# Patient Record
Sex: Female | Born: 1955 | Race: White | Hispanic: No | Marital: Married | Smoking: Former smoker
Health system: Southern US, Community
[De-identification: ages and names within clinical notes are randomized; demographics above are authoritative.]

## PROBLEM LIST (undated history)

## (undated) DIAGNOSIS — F32A Depression, unspecified: Secondary | ICD-10-CM

## (undated) DIAGNOSIS — M797 Fibromyalgia: Secondary | ICD-10-CM

## (undated) DIAGNOSIS — M549 Dorsalgia, unspecified: Secondary | ICD-10-CM

## (undated) DIAGNOSIS — F410 Panic disorder [episodic paroxysmal anxiety] without agoraphobia: Secondary | ICD-10-CM

## (undated) DIAGNOSIS — F329 Major depressive disorder, single episode, unspecified: Secondary | ICD-10-CM

## (undated) DIAGNOSIS — E119 Type 2 diabetes mellitus without complications: Secondary | ICD-10-CM

## (undated) DIAGNOSIS — I10 Essential (primary) hypertension: Secondary | ICD-10-CM

## (undated) DIAGNOSIS — F419 Anxiety disorder, unspecified: Secondary | ICD-10-CM

## (undated) HISTORY — PX: PARTIAL HYSTERECTOMY: SHX80

---

## 1998-01-22 ENCOUNTER — Emergency Department (HOSPITAL_COMMUNITY): Admission: EM | Admit: 1998-01-22 | Discharge: 1998-01-22 | Payer: Self-pay | Admitting: Emergency Medicine

## 1998-12-29 ENCOUNTER — Emergency Department (HOSPITAL_COMMUNITY): Admission: EM | Admit: 1998-12-29 | Discharge: 1998-12-29 | Payer: Self-pay | Admitting: Emergency Medicine

## 1999-01-26 ENCOUNTER — Encounter: Admission: RE | Admit: 1999-01-26 | Discharge: 1999-01-26 | Payer: Self-pay | Admitting: Obstetrics & Gynecology

## 1999-07-25 ENCOUNTER — Encounter: Admission: RE | Admit: 1999-07-25 | Discharge: 1999-07-25 | Payer: Self-pay | Admitting: Internal Medicine

## 1999-10-02 ENCOUNTER — Encounter: Admission: RE | Admit: 1999-10-02 | Discharge: 1999-10-02 | Payer: Self-pay | Admitting: Internal Medicine

## 2001-01-21 ENCOUNTER — Encounter: Payer: Self-pay | Admitting: Urology

## 2001-01-21 ENCOUNTER — Encounter: Admission: RE | Admit: 2001-01-21 | Discharge: 2001-01-21 | Payer: Self-pay | Admitting: Urology

## 2002-03-26 ENCOUNTER — Encounter: Payer: Self-pay | Admitting: Family Medicine

## 2002-03-26 ENCOUNTER — Encounter: Admission: RE | Admit: 2002-03-26 | Discharge: 2002-03-26 | Payer: Self-pay | Admitting: Family Medicine

## 2002-08-26 ENCOUNTER — Encounter: Payer: Self-pay | Admitting: Family Medicine

## 2002-08-26 ENCOUNTER — Encounter: Admission: RE | Admit: 2002-08-26 | Discharge: 2002-08-26 | Payer: Self-pay | Admitting: Family Medicine

## 2005-06-18 ENCOUNTER — Ambulatory Visit (HOSPITAL_COMMUNITY): Admission: RE | Admit: 2005-06-18 | Discharge: 2005-06-18 | Payer: Self-pay | Admitting: Family Medicine

## 2005-09-14 ENCOUNTER — Encounter: Admission: RE | Admit: 2005-09-14 | Discharge: 2005-09-14 | Payer: Self-pay | Admitting: Gastroenterology

## 2005-11-14 ENCOUNTER — Ambulatory Visit (HOSPITAL_COMMUNITY): Admission: RE | Admit: 2005-11-14 | Discharge: 2005-11-14 | Payer: Self-pay | Admitting: General Surgery

## 2005-11-14 ENCOUNTER — Encounter (INDEPENDENT_AMBULATORY_CARE_PROVIDER_SITE_OTHER): Payer: Self-pay | Admitting: *Deleted

## 2005-12-12 ENCOUNTER — Encounter: Admission: RE | Admit: 2005-12-12 | Discharge: 2005-12-12 | Payer: Self-pay | Admitting: Family Medicine

## 2006-08-02 ENCOUNTER — Encounter: Admission: RE | Admit: 2006-08-02 | Discharge: 2006-08-02 | Payer: Self-pay | Admitting: Gastroenterology

## 2006-10-19 ENCOUNTER — Encounter: Admission: RE | Admit: 2006-10-19 | Discharge: 2006-10-19 | Payer: Self-pay | Admitting: Family Medicine

## 2007-09-28 ENCOUNTER — Emergency Department (HOSPITAL_COMMUNITY): Admission: EM | Admit: 2007-09-28 | Discharge: 2007-09-28 | Payer: Self-pay | Admitting: *Deleted

## 2010-10-15 ENCOUNTER — Encounter: Payer: Self-pay | Admitting: Family Medicine

## 2011-02-09 NOTE — Op Note (Signed)
Sheryl Stone, Sheryl Stone NO.:  [id number]   MEDICAL RECORD NO.:  [id number]          PATIENT TYPE:  INP   LOCATION:  2899                         FACILITY:  [facility]   PHYSICIAN:  Cherylynn Ridges, M.D.    DATE OF BIRTH:  March 13, 1956   DATE OF PROCEDURE:  11/14/2005  DATE OF DISCHARGE:  11/14/2005                                 OPERATIVE REPORT   PREOPERATIVE DIAGNOSES:  Symptomatic cholelithiasis and gallbladder polyps.   POSTOPERATIVE DIAGNOSES:  Symptomatic cholelithiasis and gallbladder polyps.   OPERATION PERFORMED:  Laparoscopic cholecystectomy with cholangiogram.   SURGEON:  Marta Lamas. Lindie Spruce, M.D.   ASSISTANT:  Wilmon Arms. Tsuei, M.D..   ANESTHESIA:  General endotracheal.   ESTIMATED BLOOD LOSS:  Less than 20 mL.   COMPLICATIONS:  None.   CONDITION:  Stable.   INDICATIONS FOR PROCEDURE:  The patient is a 55 year old with gallstones and  gallbladder polyps and abdominal pain who comes for an elective laparoscopic  cholecystectomy.   OPERATIVE FINDINGS:  The patient had mild adhesions to her gallbladder to  the body and near the infundibulum. The cholangiogram showed normal flow  into the duodenum, no dilated common duct, no intraductal defects, good  proximal flow.   DESCRIPTION OF PROCEDURE:  The patient was taken to the operating room and  placed on the table in the supine position.  After an adequate general  anesthetic was administered, the patient was prepped and draped in the usual  sterile manner exposing the midline and the right upper quadrant.   A supraumbilical curvilinear incision was made using a #11 blade and taken  down to the midline fascia.  It was at this midline fascia that Kocher  clamps were attached to the fascia and incision made between there with a  #15 blade then we bluntly dissected down to the peritoneum using a Kelly  clamp.   Once we had adequately entered the peritoneal cavity, a pursestring suture  of 0 Vicryl was passed  around the fascial incision. Then Hasson cannula  passed in place and secured in place with pursestring suture.   We insufflated carbon dioxide gas up to a maximal pressure of 15 mmHg after  inserting the Hasson cannula.  We then passed through the right costal  margin 5 mm cannulas, in the subxiphoid 11-12 cannula under direct vision.  Once all cannulas were in place and the abdomen was adequately insufflated  and distended, we placed the patient in reversed Trendelenburg.  The left  side was tilted down and the dissection begun.   There were some mild adhesions to the body and the infundibulum of the  gallbladder which were taken down bluntly using dissector and  electrocautery.  We then dissected out the peritoneum overlying the triangle  of Calot and the hepatoduodenal triangle exposing the cystic duct and the  cystic artery.  In this case the cystic artery coursed external to the  triangle of Calot and was outside of the cystic duct.  We endoclipped it  triply proximally and then distally times one along the gallbladder side and  then transected the artery.  We then  dissected out the cystic duct and then  placed a proximal clip along the gallbladder side.  Cholecystodochotomy was  made using endoscopic scissors, then a Cook catheter which had been passed  through the anterior abdominal wall was passed into the cholecystodochotomy  and the cholangiogram was done as previously dictated.   Once the cholangiogram was clear, we removed the removed the catheter and  the clip that was holding it in place and we endoclipped the distal cystic  duct x3 and transected it.  We then dissected out the gallbladder from its  bed with minimal difficulty although we did puncture the gallbladder,  releasing clear bile.   The bile was irrigated and aspirated free.  We dissected out the gallbladder  from its bed and passed it through the supraumbilical fascia using an  EndoCatch bag.  The gallbladder  bed was free of significant bleeding;  however, was cauterized for hemostasis.   Once the gallbladder was completely freed and removed, we reapproximated the  supraumbilical fascia using the pursestring suture which was in place.  We  then injected 0.25% Marcaine with epinephrine at all sites.  We aspirated  above the liver with the aspirator as we removed all cannulas and removed  all gas and fluid.   Again, the supraumbilical site was closed using the pursestring suture, the  skin at all sites closed using a running subcuticular stitch of 4-0 Vicryl.  0.25% Marcaine with epinephrine was injected at all sites and sterile  dressings were applied.  All sponge, needle and instrument counts were  correct.      Cherylynn Ridges, M.D.  Electronically Signed     JOW/MEDQ  D:  11/14/2005  T:  11/15/2005  Job:  [id number]   cc:   Molly Maduro L. Foy [place], M.D.  Fax: [phone]   Jordan Hawks. Elnoria Howard, MD  Fax: [phone]

## 2011-06-14 LAB — BASIC METABOLIC PANEL
BUN: 8
CO2: 24
Calcium: 9.4
Chloride: 101
Creatinine, Ser: 0.77
GFR calc non Af Amer: >60
Glucose, Bld: 104 — ABNORMAL HIGH
Sodium: 135

## 2011-06-14 LAB — DIFFERENTIAL
Basophils Absolute: 0.2 — ABNORMAL HIGH
Basophils Relative: 2 — ABNORMAL HIGH
Eosinophils Relative: 2
Monocytes Absolute: 0.4
Monocytes Relative: 6

## 2011-06-14 LAB — CBC
Hemoglobin: 15.1 — ABNORMAL HIGH
RBC: 4.48
RDW: 13.6
WBC: 8.1

## 2011-06-14 LAB — URINALYSIS, ROUTINE W REFLEX MICROSCOPIC
Bilirubin Urine: NEGATIVE
Hgb urine dipstick: NEGATIVE
Ketones, ur: NEGATIVE
Nitrite: NEGATIVE
Urobilinogen, UA: 0.2
pH: 6.5

## 2011-06-14 LAB — POCT CARDIAC MARKERS
CKMB, poc: 1.1
Myoglobin, poc: 67.6

## 2015-08-04 ENCOUNTER — Other Ambulatory Visit: Payer: Self-pay | Admitting: Family Medicine

## 2015-08-24 ENCOUNTER — Other Ambulatory Visit: Payer: Self-pay | Admitting: Family Medicine

## 2015-10-06 MED FILL — ALPRAZolam 1 MG TABS: 1 | 30 days supply | Qty: 90 | Fill #1

## 2015-10-17 MED FILL — ATENOLOL 25 MG TABLET: 25 | 30 days supply | Qty: 30 | Fill #2

## 2015-11-04 MED FILL — ALPRAZolam 1 MG TABS: 1 | 30 days supply | Qty: 90 | Fill #2

## 2015-11-14 MED FILL — ATENOLOL 25 MG TABLET: 25 | 30 days supply | Qty: 30 | Fill #3

## 2015-12-02 MED FILL — ALPRAZolam 1 MG TABS: 1 | 30 days supply | Qty: 90 | Fill #3

## 2015-12-12 MED FILL — ATENOLOL 25 MG TABLET: 25 | 30 days supply | Qty: 30 | Fill #4

## 2015-12-22 ENCOUNTER — Other Ambulatory Visit: Payer: Self-pay

## 2016-01-04 MED FILL — ALPRAZolam 1 MG TABS: 1 | 30 days supply | Qty: 90 | Fill #4

## 2016-01-11 MED FILL — ATENOLOL 25 MG TABLET: 25 | 30 days supply | Qty: 30 | Fill #5

## 2016-01-17 ENCOUNTER — Emergency Department (HOSPITAL_BASED_OUTPATIENT_CLINIC_OR_DEPARTMENT_OTHER): Payer: Self-pay

## 2016-01-17 ENCOUNTER — Emergency Department (HOSPITAL_BASED_OUTPATIENT_CLINIC_OR_DEPARTMENT_OTHER)
Admission: EM | Admit: 2016-01-17 | Discharge: 2016-01-17 | Disposition: A | Discharge status: Home / Self Care | Payer: Self-pay | Attending: Emergency Medicine | Admitting: Emergency Medicine

## 2016-01-17 ENCOUNTER — Encounter (HOSPITAL_BASED_OUTPATIENT_CLINIC_OR_DEPARTMENT_OTHER): Payer: Self-pay | Admitting: *Deleted

## 2016-01-17 DIAGNOSIS — K2100 Gastro-esophageal reflux disease with esophagitis, without bleeding: Secondary | ICD-10-CM

## 2016-01-17 DIAGNOSIS — F410 Panic disorder [episodic paroxysmal anxiety] without agoraphobia: Secondary | ICD-10-CM | POA: Insufficient documentation

## 2016-01-17 DIAGNOSIS — R634 Abnormal weight loss: Secondary | ICD-10-CM | POA: Insufficient documentation

## 2016-01-17 DIAGNOSIS — Z880 Allergy status to penicillin: Secondary | ICD-10-CM | POA: Insufficient documentation

## 2016-01-17 DIAGNOSIS — Z79899 Other long term (current) drug therapy: Secondary | ICD-10-CM | POA: Insufficient documentation

## 2016-01-17 DIAGNOSIS — F329 Major depressive disorder, single episode, unspecified: Secondary | ICD-10-CM | POA: Insufficient documentation

## 2016-01-17 DIAGNOSIS — Z7982 Long term (current) use of aspirin: Secondary | ICD-10-CM | POA: Insufficient documentation

## 2016-01-17 DIAGNOSIS — I10 Essential (primary) hypertension: Secondary | ICD-10-CM | POA: Insufficient documentation

## 2016-01-17 DIAGNOSIS — M797 Fibromyalgia: Secondary | ICD-10-CM | POA: Insufficient documentation

## 2016-01-17 DIAGNOSIS — Z87891 Personal history of nicotine dependence: Secondary | ICD-10-CM | POA: Insufficient documentation

## 2016-01-17 DIAGNOSIS — G8929 Other chronic pain: Secondary | ICD-10-CM | POA: Insufficient documentation

## 2016-01-17 DIAGNOSIS — F419 Anxiety disorder, unspecified: Secondary | ICD-10-CM

## 2016-01-17 HISTORY — DX: Panic disorder (episodic paroxysmal anxiety): F41.0

## 2016-01-17 HISTORY — DX: Dorsalgia, unspecified: M54.9

## 2016-01-17 HISTORY — DX: Anxiety disorder, unspecified: F41.9

## 2016-01-17 HISTORY — DX: Depression, unspecified: F32.A

## 2016-01-17 HISTORY — DX: Major depressive disorder, single episode, unspecified: F32.9

## 2016-01-17 HISTORY — DX: Essential (primary) hypertension: I10

## 2016-01-17 HISTORY — DX: Fibromyalgia: M79.7

## 2016-01-17 LAB — CBC WITH DIFFERENTIAL/PLATELET
Basophils Absolute: 0 10*3/uL (ref 0.0–0.1)
Basophils Relative: 0 %
Eosinophils Absolute: 0 10*3/uL (ref 0.0–0.7)
Eosinophils Relative: 1 %
HEMATOCRIT: 38.9 % (ref 36.0–46.0)
Hemoglobin: 13.2 g/dL (ref 12.0–15.0)
LYMPHS PCT: 22 %
Lymphs Abs: 1.2 10*3/uL (ref 0.7–4.0)
MCH: 33.8 pg (ref 26.0–34.0)
MCHC: 33.9 g/dL (ref 30.0–36.0)
MCV: 99.7 fL (ref 78.0–100.0)
MONO ABS: 0.6 10*3/uL (ref 0.1–1.0)
MONOS PCT: 12 %
NEUTROS ABS: 3.5 10*3/uL (ref 1.7–7.7)
Neutrophils Relative %: 65 %
Platelets: 267 10*3/uL (ref 150–400)
RBC: 3.9 MIL/uL (ref 3.87–5.11)
RDW: 13.8 % (ref 11.5–15.5)
WBC: 5.4 10*3/uL (ref 4.0–10.5)

## 2016-01-17 LAB — COMPREHENSIVE METABOLIC PANEL
ALK PHOS: 63 U/L (ref 38–126)
ALT: 25 U/L (ref 14–54)
AST: 22 U/L (ref 15–41)
Albumin: 4.2 g/dL (ref 3.5–5.0)
Anion gap: 13 (ref 5–15)
BILIRUBIN TOTAL: 0.5 mg/dL (ref 0.3–1.2)
BUN: 11 mg/dL (ref 6–20)
CALCIUM: 9 mg/dL (ref 8.9–10.3)
CO2: 25 mmol/L (ref 22–32)
Chloride: 94 mmol/L — ABNORMAL LOW (ref 101–111)
Creatinine, Ser: 0.55 mg/dL (ref 0.44–1.00)
GFR calc Af Amer: >60 mL/min (ref >60)
Glucose, Bld: 100 mg/dL — ABNORMAL HIGH (ref 65–99)
POTASSIUM: 4.1 mmol/L (ref 3.5–5.1)
Sodium: 132 mmol/L — ABNORMAL LOW (ref 135–145)
TOTAL PROTEIN: 7.3 g/dL (ref 6.5–8.1)

## 2016-01-17 LAB — TROPONIN I

## 2016-01-17 NOTE — Discharge Instructions (Signed)
Outpatient Psychiatry and Counseling  [facility]: Mobile Crisis Management 24 hours:  [phone]  [facility] sliding scale fee and walk in schedule: M-F 8am-12pm/1pm-3pm [address]  [place], [place] [address] [phone]  [facility] [address] [place], [place] [address] [phone]  [facility] ([facility])- new patient walk-in appointments available Monday - Friday 8am -3pm.          [address] [place], [place] [address] [phone] or crisis line- [phone]  [facility] Outpatient Services/ Intensive Outpatient Therapy Program [address] [place], [place] [address] [phone]  [facility]                  Crisis Services      [phone]      [address]     [place], [place] [address]                 [facility]   [facility] [phone] [address] [place], [place] [address]   [facility]          [address],  [place], [place] [address]       [phone]  [facility] [address] [place], [place] [address] [phone]  [facility]    [address]    [place], [place] [address]     [phone]       Parish McKinney, MD     [address]     [place] [address]     [phone]       [facility] [address] [place] [address]  [facility]     [address]     [place], [place]      [phone]       [facility] Shamsher Ahluwalia, MD [address] [place], [place] [address] [phone]  [facility]     [address]     [place], [place] [address]     [phone]       [facility] [address] [place], [place] [address] [phone] Resources for Temporary  Residential Assistance/Crisis Centers  [facility] M-F 8am-3pm   [address] [place], [place] [address]   [phone] Services include: laundry, barbering, support groups, case management, phone  & computer access, showers, AA/NA mtgs, mental health/substance abuse nurse, job skills class, disability information, VA assistance, spiritual classes, etc.   HOMELESS SHELTERS  [facility]     [facility]   [address], [place]     [phone]              [facility] (women and children)       [address] [place], [place] [address] [phone] [email or web address] for application and process Application Required  [facility]   [address]    [place] [address]     [phone]                    [facility] [address] , [place] [address] [phone](schedule application appt.) Application Required  [facility] (women only)    [address]     [place], [place] [address]       [phone]      Intake starts 6pm daily Need valid ID, SSC, & Police report [facility] [place] [address] [place], [place] [phone] Application Required  [facility] (men only)     [address]      [place], [place]     [phone]       [facility] (Pregnant women only) [address] [place], [place] [phone]  [facility]      [address]      [place], [place] [address]     [phone]             [facility] [place] [facility] [address] [place], [place] [phone] 90 day commitment/SA/Application process  [facility](men only)     [address]     [place], [place]     [phone]       Check-in at 7pm            [facility] [address] [place], [place] [address] [phone] Men/Women/Women and Children  must be there by 7 pm  [facility] [place], [place] [phone]                 

## 2016-01-17 NOTE — ED Notes (Signed)
Patient states she has a long history of chest pain and anxiety.  Recently has lost her daughter (Nov 2016), and now is raising her 62 year old grandson.  States there have been other family members who have died and is also contributing to her stress.  States she has not felt well for years, has problems with sleeping and poor appetite.

## 2016-01-17 NOTE — ED Notes (Signed)
Patient ambulates to and from radiology department. 

## 2016-01-17 NOTE — ED Notes (Signed)
MD at bedside. 

## 2016-01-17 NOTE — ED Provider Notes (Addendum)
CSN: [id number]     Arrival date & time 01/17/16  2778 History   First MD Initiated Contact with Patient 01/17/16 [phone]     Chief Complaint  Patient presents with  . Chest Pain     (Consider location/radiation/quality/duration/timing/severity/associated sxs/prior Treatment) HPI Comments: Patient is a 60 year old female with a history of anxiety, fibromyalgia and hypertension presenting today with chest pain. Patient states chest pain has been ongoing for months but seemed to be worse since 3 AM this morning. She describes it as a tight uncomfortable sensation in her chest. She feels that sometimes it is worse with eating but always worse with stress. Patient has endured multiple stressors in the last 6 months including the death of her 30 year old daughter, death of brother and sister-in-law's as well is being completely responsible for her six-year-old grandson. She currently takes Xanax 3 times a day but denies alcohol use or other drug use other than atenolol. She complains of insomnia, constant anxiety, sadness, decreased desire to eat. Because she felt so bad today she decided to come in to be checked out. She has no prior cardiac history. She stopped smoking approximately one month ago. She states that she has a counselor she wants to meet with 2 talk with someone about her daughter's death but she has not met with her yet. She states she has been tried on multiple antidepressants but they cause bad side effects.  Patient is a 60 y.o. female presenting with chest pain. The history is provided by the patient.  Chest Pain Pain location:  Epigastric and substernal area Pain quality: aching and shooting   Pain radiates to:  Does not radiate Pain radiates to the back: no   Pain severity:  Moderate Onset quality:  Gradual Timing:  Intermittent Progression:  Worsening Chronicity:  Recurrent Context: eating and stress   Relieved by:  None tried Worsened by:  Nothing tried Ineffective  treatments:  None tried Associated symptoms: abdominal pain and anorexia   Associated symptoms: no cough, no diaphoresis, no fever, no nausea, no shortness of breath and not vomiting   Associated symptoms comment:  Chronic abdominal pain for years and back pain which she was told was fibromyalgia.  Insomnia, weight loss, anxiety Risk factors: hypertension   Risk factors: no immobilization   Risk factors comment:  Stopped smoking last month   Past Medical History  Diagnosis Date  . Hypertension   . Anxiety and depression   . Fibromyalgia   . Panic attacks   . Back pain    Past Surgical History  Procedure Laterality Date  . Partial hysterectomy     No family history on file. Social History  Substance Use Topics  . Smoking status: Former Research scientist (life sciences)  . Smokeless tobacco: None  . Alcohol Use: No   OB History    No data available     Review of Systems  Constitutional: Negative for fever and diaphoresis.  Respiratory: Negative for cough and shortness of breath.   Cardiovascular: Positive for chest pain.  Gastrointestinal: Positive for abdominal pain and anorexia. Negative for nausea and vomiting.  All other systems reviewed and are negative.     Allergies  Codeine; Penicillins; Sulfur; and Tramadol  Home Medications   Prior to Admission medications   Medication Sig Start Date End Date Taking? Authorizing Provider  ALPRAZolam Duanne Moron) 0.25 MG tablet Take 0.25 mg by mouth at bedtime as needed for anxiety.   Yes Historical Provider, MD  aspirin 81 MG tablet Take 81 mg  by mouth daily.   Yes Historical Provider, MD  ATENOLOL PO Take by mouth.   Yes Historical Provider, MD  Cholecalciferol ([employer] VIT D 5000 HIGH-POTENCY PO) Take by mouth.   Yes Historical Provider, MD  Multiple Vitamin (MULTIVITAMIN) tablet Take 1 tablet by mouth daily.   Yes Historical Provider, MD   BP 188/88 mmHg  Pulse 61  Resp 24  SpO2 100% Physical Exam  Constitutional: She is oriented to person, place,  and time. She appears well-developed. She appears cachectic.  Appears anxious  HENT:  Head: Normocephalic and atraumatic.  Mouth/Throat: Oropharynx is clear and moist. Mucous membranes are dry.  Eyes: Conjunctivae and EOM are normal. Pupils are equal, round, and reactive to light.  Neck: Normal range of motion. Neck supple.  Cardiovascular: Normal rate, regular rhythm and intact distal pulses.   No murmur heard. Pulmonary/Chest: Effort normal and breath sounds normal. No respiratory distress. She has no wheezes. She has no rales.  Abdominal: Soft. She exhibits no distension. There is tenderness. There is no rebound and no guarding.  Diffuse tenderness throughout the abdomen without any focal masses  Musculoskeletal: Normal range of motion. She exhibits no edema or tenderness.  Neurological: She is alert and oriented to person, place, and time.  Skin: Skin is warm and dry. No rash noted. No erythema.  Psychiatric: She has a normal mood and affect. Her behavior is normal.  Nursing note and vitals reviewed.   ED Course  Procedures (including critical care time) Labs Review Labs Reviewed  COMPREHENSIVE METABOLIC PANEL - Abnormal; Notable for the following:    Sodium 132 (*)    Chloride 94 (*)    Glucose, Bld 100 (*)    All other components within normal limits  CBC WITH DIFFERENTIAL/PLATELET  TROPONIN I    Imaging Review Dg Chest 2 View  01/17/2016  CLINICAL DATA:  60 year old female with chest pain and panic attacks EXAM: CHEST  2 VIEW COMPARISON:  Prior chest x-ray 05/26/2015 FINDINGS: The lungs are clear and negative for focal airspace consolidation, pulmonary edema or suspicious pulmonary nodule. Stable hyperinflation and mild bronchitic GH. No pleural effusion or pneumothorax. Cardiac and mediastinal contours are within normal limits. No acute fracture or lytic or blastic osseous lesions. The visualized upper abdominal bowel gas pattern is unremarkable. IMPRESSION: No active  cardiopulmonary disease. Stable background changes of COPD and aortic atherosclerosis. Electronically Signed   By: Jacqulynn Cadet M.D.   On: 01/17/2016 09:17   I have personally reviewed and evaluated these images and lab results as part of my medical decision-making.   EKG Interpretation   Date/Time:  Tuesday January 17 2016 08:36:34 EDT Ventricular Rate:  60 PR Interval:  160 QRS Duration: 94 QT Interval:  437 QTC Calculation: 437 R Axis:   64 Text Interpretation:  Sinus rhythm Probable left atrial enlargement Normal  ECG Confirmed by Maryan Rued  MD, Loree Fee ([id number]) on 01/17/2016 8:51:39 AM      MDM   Final diagnoses:  Anxiety  Gastroesophageal reflux disease with esophagitis  Patient is a 60 year old lady presenting with a complaint of chest pain in the setting of high anxiety and stressors. Patient is a heart score of 2 and low risk for PE. Patient's symptoms did not sound like a PE and she has no risk factors other than history of present illness.  Chest pain is most likely related to stress and GI issues. It's worse with lying down and sometimes occurs after eating. She has not taken any medication  for her stomach because of side effects to medications. Will recommend dietary changes as well as Zantac if she would like to try that. Also encouraged patient to follow-up with the counselor as she is unable to take an antidepressant/anxiety medication.  EKG is within normal limits today, chest x-ray within normal limits. CBC, CMP, troponin pending.  Labs without acute findings.  Will d /c home to f/u as outpt  Blanchie Dessert, MD 01/17/16 1023  Blanchie Dessert, MD 01/17/16 1053

## 2016-01-19 MED FILL — FLUoxetine HCL 10 MG TABS: 10 | 30 days supply | Qty: 30 | Fill #0

## 2016-02-02 MED FILL — ALPRAZolam 1 MG TABS: 1 | 20 days supply | Qty: 90 | Fill #0

## 2016-02-09 MED FILL — ATENOLOL 25 MG TABLET: 25 | 30 days supply | Qty: 30 | Fill #0

## 2016-03-05 MED FILL — ALPRAZolam 1 MG TABS: 1 | 20 days supply | Qty: 90 | Fill #1

## 2016-03-07 MED FILL — AZITHROMYCIN 250 MG TABLET: 250 | 5 days supply | Qty: 6 | Fill #0

## 2016-03-12 MED FILL — ATENOLOL 25 MG TABLET: 25 | 30 days supply | Qty: 30 | Fill #1

## 2016-03-29 MED FILL — ALPRAZolam 1 MG TABS: 1 | 20 days supply | Qty: 90 | Fill #2

## 2016-04-11 MED FILL — ATENOLOL 25 MG TABLET: 25 | 30 days supply | Qty: 30 | Fill #2

## 2016-04-30 MED FILL — ALPRAZolam 1 MG TABS: 1 | 20 days supply | Qty: 90 | Fill #3

## 2016-05-07 MED FILL — ATENOLOL 25 MG TABLET: 25 | 30 days supply | Qty: 30 | Fill #0

## 2016-06-04 MED FILL — ALPRAZolam 1 MG TABS: 1 | 20 days supply | Qty: 90 | Fill #4

## 2016-06-04 MED FILL — ATENOLOL 25 MG TABLET: 25 | 30 days supply | Qty: 30 | Fill #1

## 2016-07-02 MED FILL — ATENOLOL 25 MG TABLET: 25 | 30 days supply | Qty: 30 | Fill #2

## 2016-07-02 MED FILL — ALPRAZolam 1 MG TABS: 1 | 20 days supply | Qty: 90 | Fill #5

## 2016-07-30 MED FILL — ATENOLOL 25 MG TABLET: 25 | 30 days supply | Qty: 30 | Fill #0

## 2016-07-30 MED FILL — ALPRAZolam 1 MG TABS: 1 | 20 days supply | Qty: 90 | Fill #0

## 2016-09-03 MED FILL — ALPRAZolam 1 MG TABS: 1 | 20 days supply | Qty: 90 | Fill #1

## 2016-09-03 MED FILL — ATENOLOL 25 MG TABLET: 25 | 30 days supply | Qty: 30 | Fill #1

## 2016-10-08 MED FILL — ATENOLOL 25 MG TABLET: 25 | 30 days supply | Qty: 30 | Fill #2

## 2016-10-08 MED FILL — ALPRAZolam 1 MG TABS: 1 | 20 days supply | Qty: 90 | Fill #2

## 2016-11-05 MED FILL — ATENOLOL 25 MG TABLET: 25 | 30 days supply | Qty: 30 | Fill #3

## 2016-11-05 MED FILL — ALPRAZolam 1 MG TABS: 1 | 20 days supply | Qty: 90 | Fill #3

## 2016-12-03 MED FILL — ALPRAZolam 1 MG TABS: 1 | 20 days supply | Qty: 90 | Fill #4

## 2016-12-03 MED FILL — ATENOLOL 25 MG TABLET: 25 | 30 days supply | Qty: 30 | Fill #4

## 2016-12-06 MED FILL — GABAPENTIN 100 MG CAP: 100 | 30 days supply | Qty: 30 | Fill #0

## 2016-12-06 MED FILL — AZITHROMYCIN 250 MG TABLET: 250 | 5 days supply | Qty: 6 | Fill #0

## 2016-12-31 MED FILL — ALPRAZolam 1 MG TABS: 1 | 20 days supply | Qty: 90 | Fill #5

## 2016-12-31 MED FILL — GABAPENTIN 100 MG CAPSULE: 100 | 30 days supply | Qty: 90 | Fill #0

## 2016-12-31 MED FILL — ATENOLOL 25 MG TABLET: 25 | 30 days supply | Qty: 30 | Fill #5

## 2017-01-28 MED FILL — ALPRAZolam 1 MG TABS: 1 | 20 days supply | Qty: 90 | Fill #0

## 2017-01-28 MED FILL — ATENOLOL 25 MG TABLET: 25 | 30 days supply | Qty: 30 | Fill #0

## 2017-02-25 MED FILL — ATENOLOL 25 MG TABLET: 25 | 30 days supply | Qty: 30 | Fill #1

## 2017-02-25 MED FILL — ALPRAZolam 1 MG TABS: 1 | 20 days supply | Qty: 90 | Fill #1

## 2017-03-25 MED FILL — ATENOLOL 25 MG TABLET: 25 | 30 days supply | Qty: 30 | Fill #2

## 2017-03-25 MED FILL — ALPRAZolam 1 MG TABS: 1 | 20 days supply | Qty: 90 | Fill #2

## 2017-04-29 MED FILL — ALPRAZolam 1 MG TABS: 1 | 20 days supply | Qty: 90 | Fill #3

## 2017-04-29 MED FILL — ATENOLOL 25 MG TABLET: 25 | 30 days supply | Qty: 30 | Fill #3

## 2017-05-28 MED FILL — ATENOLOL 25 MG TABLET: 25 | 30 days supply | Qty: 30 | Fill #0

## 2017-05-28 MED FILL — ALPRAZolam 1 MG TABS: 1 | 20 days supply | Qty: 90 | Fill #4

## 2017-06-03 MED FILL — AMITRIPTYLINE HCL 10 MG TAB: 10 | 30 days supply | Qty: 30 | Fill #0

## 2017-06-28 MED FILL — ATENOLOL 25 MG TABLET: 25 | 30 days supply | Qty: 30 | Fill #0

## 2017-06-28 MED FILL — ALPRAZolam 1 MG TABS: 1 | 20 days supply | Qty: 90 | Fill #0

## 2017-06-28 MED FILL — AMITRIPTYLINE HCL 10 MG TAB: 10 | 30 days supply | Qty: 30 | Fill #1

## 2017-07-26 MED FILL — AMITRIPTYLINE HCL 10 MG TAB: 10 | 30 days supply | Qty: 30 | Fill #0

## 2017-07-29 MED FILL — ATENOLOL 25 MG TABLET: 25 | 30 days supply | Qty: 30 | Fill #1

## 2017-07-29 MED FILL — ALPRAZolam 1 MG TABS: 1 | 20 days supply | Qty: 90 | Fill #1

## 2017-08-23 MED FILL — ALPRAZolam 1 MG TABS: 1 | 20 days supply | Qty: 90 | Fill #2

## 2017-08-23 MED FILL — AMITRIPTYLINE HCL 10 MG TAB: 10 | 30 days supply | Qty: 30 | Fill #1

## 2017-08-23 MED FILL — ATENOLOL 25 MG TABLET: 25 | 30 days supply | Qty: 30 | Fill #2

## 2017-09-20 MED FILL — ATENOLOL 25 MG TABLET: 25 | 30 days supply | Qty: 30 | Fill #3

## 2017-09-20 MED FILL — AMITRIPTYLINE HCL 10 MG TAB: 10 | 30 days supply | Qty: 30 | Fill #2

## 2017-09-20 MED FILL — ALPRAZolam 1 MG TABS: 1 | 20 days supply | Qty: 90 | Fill #3

## 2017-09-23 ENCOUNTER — Emergency Department (HOSPITAL_BASED_OUTPATIENT_CLINIC_OR_DEPARTMENT_OTHER)
Admission: EM | Admit: 2017-09-23 | Discharge: 2017-09-23 | Disposition: A | Discharge status: Home / Self Care | Payer: Self-pay | Attending: Physician Assistant | Admitting: Physician Assistant

## 2017-09-23 ENCOUNTER — Emergency Department (HOSPITAL_BASED_OUTPATIENT_CLINIC_OR_DEPARTMENT_OTHER): Payer: Self-pay

## 2017-09-23 ENCOUNTER — Encounter (HOSPITAL_BASED_OUTPATIENT_CLINIC_OR_DEPARTMENT_OTHER): Payer: Self-pay | Admitting: *Deleted

## 2017-09-23 ENCOUNTER — Other Ambulatory Visit: Payer: Self-pay

## 2017-09-23 DIAGNOSIS — Z7982 Long term (current) use of aspirin: Secondary | ICD-10-CM | POA: Insufficient documentation

## 2017-09-23 DIAGNOSIS — R109 Unspecified abdominal pain: Secondary | ICD-10-CM | POA: Insufficient documentation

## 2017-09-23 DIAGNOSIS — G8929 Other chronic pain: Secondary | ICD-10-CM | POA: Insufficient documentation

## 2017-09-23 DIAGNOSIS — Z87891 Personal history of nicotine dependence: Secondary | ICD-10-CM | POA: Insufficient documentation

## 2017-09-23 DIAGNOSIS — Z79899 Other long term (current) drug therapy: Secondary | ICD-10-CM | POA: Insufficient documentation

## 2017-09-23 DIAGNOSIS — I10 Essential (primary) hypertension: Secondary | ICD-10-CM | POA: Insufficient documentation

## 2017-09-23 LAB — COMPREHENSIVE METABOLIC PANEL
ALK PHOS: 80 U/L (ref 38–126)
ALT: 15 U/L (ref 14–54)
AST: 20 U/L (ref 15–41)
Albumin: 4 g/dL (ref 3.5–5.0)
Anion gap: 8 (ref 5–15)
BUN: 8 mg/dL (ref 6–20)
CALCIUM: 9 mg/dL (ref 8.9–10.3)
CO2: 25 mmol/L (ref 22–32)
CREATININE: 0.82 mg/dL (ref 0.44–1.00)
Chloride: 97 mmol/L — ABNORMAL LOW (ref 101–111)
GFR calc non Af Amer: >60 mL/min (ref >60)
GLUCOSE: 100 mg/dL — AB (ref 65–99)
Potassium: 4.3 mmol/L (ref 3.5–5.1)
SODIUM: 130 mmol/L — AB (ref 135–145)
Total Bilirubin: 0.3 mg/dL (ref 0.3–1.2)
Total Protein: 7.2 g/dL (ref 6.5–8.1)

## 2017-09-23 LAB — URINALYSIS, ROUTINE W REFLEX MICROSCOPIC
BILIRUBIN URINE: NEGATIVE
Glucose, UA: NEGATIVE mg/dL
Hgb urine dipstick: NEGATIVE
Ketones, ur: NEGATIVE mg/dL
Leukocytes, UA: NEGATIVE
NITRITE: NEGATIVE
PH: 7.5 (ref 5.0–8.0)
Protein, ur: NEGATIVE mg/dL

## 2017-09-23 LAB — CBC
HCT: 36.7 % (ref 36.0–46.0)
Hemoglobin: 12.6 g/dL (ref 12.0–15.0)
MCH: 32.9 pg (ref 26.0–34.0)
MCHC: 34.3 g/dL (ref 30.0–36.0)
MCV: 95.8 fL (ref 78.0–100.0)
PLATELETS: 338 10*3/uL (ref 150–400)
RBC: 3.83 MIL/uL — ABNORMAL LOW (ref 3.87–5.11)
RDW: 12.7 % (ref 11.5–15.5)
WBC: 6.6 10*3/uL (ref 4.0–10.5)

## 2017-09-23 LAB — LIPASE, BLOOD: Lipase: 32 U/L (ref 11–51)

## 2017-09-23 MED ORDER — IOPAMIDOL (ISOVUE-300) INJECTION 61%
100.0000 mL | Freq: Once | INTRAVENOUS | Status: AC | PRN
  Administered 2017-09-23: 100 mL via INTRAVENOUS

## 2017-09-23 MED ORDER — SENNOSIDES-DOCUSATE SODIUM 8.6-50 MG PO TABS: 1.0000 | ORAL_TABLET | Freq: Every day | ORAL | 0 refills | Status: DC

## 2017-09-23 NOTE — ED Notes (Signed)
Patient transported to CT 

## 2017-09-23 NOTE — Discharge Instructions (Signed)
The good news is that we did not find anything emergent on your CAT scan.  There were several things that you need to follow-up on.  You will need to have a additional imaging in the future.  They are outlined below.  In addition we recommend that you start taking Senokot to help keep your bowels moving.  We have given you a phone number of [facility] to follow-up with to be evaluated given your chronic abdominal pain.  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  11 mm probable hemorrhagic cyst in the anterior left lower kidney, although technically indeterminate. Consider follow-up CT abdomen with/ without contrast or MRI abdomen (renal protocol) with without contrast in 6 months for further characterization, as clinically warranted.  2.1 cm right ovarian cystic lesion, likely benign. In a postmenopausal patient, follow-up ultrasound is suggested in 1 year.

## 2017-09-23 NOTE — ED Notes (Signed)
Pt reports chronic issues with constipation, takes fiber daily. Pt states she is not constipated but has had increasing difficulty with BM over the past week. Also reports palpitations with SOB last night.

## 2017-09-23 NOTE — ED Provider Notes (Addendum)
[facility] EMERGENCY DEPARTMENT Provider Note   CSN: [id number] Arrival date & time: 09/23/17  1214     History   Chief Complaint Chief Complaint  Patient presents with  . Abdominal Pain    HPI Sheryl Stone is a 61 y.o. female.  HPI   Pt is a 61 yo presenting wth lots of complaints.  Patient is onccnerned because of all of the colors of her stools.  She thinks it is different colors in the toilet than on the paper.  Patient has had two bowel movements today.  Patient was started on miralax (which she did not like) , and is on benefiber. The pain has been constant for the last 3 montsh.  SHe reports right sided firbomyalgia per her PCP.  Patient reports the pain is gotten worse the last 2 days. Past Medical History:  Diagnosis Date  . Anxiety and depression   . Back pain   . Fibromyalgia   . Hypertension   . Panic attacks     There are no active problems to display for this patient.   Past Surgical History:  Procedure Laterality Date  . PARTIAL HYSTERECTOMY      OB History    No data available       Home Medications    Prior to Admission medications   Medication Sig Start Date End Date Taking? Authorizing Provider  ALPRAZolam Prudy Feeler) 0.25 MG tablet Take 0.25 mg by mouth at bedtime as needed for anxiety.   Yes [provider]  aspirin 81 MG tablet Take 81 mg by mouth daily.   Yes [provider]  ATENOLOL PO Take by mouth.   Yes [provider]  Cholecalciferol ([employer] VIT D 5000 HIGH-POTENCY PO) Take by mouth.   Yes [provider]  Multiple Vitamin (MULTIVITAMIN) tablet Take 1 tablet by mouth daily.   Yes [provider]  senna-docusate (SENOKOT-S) 8.6-50 MG tablet Take 1 tablet by mouth at bedtime. 09/23/17   Debbe Crumble, Cindee Salt, MD    Family History No family history on file.  Social History Social History   Tobacco Use  . Smoking status: Former Games developer  . Smokeless tobacco: Never Used    Substance Use Topics  . Alcohol use: No  . Drug use: No     Allergies   Codeine; Penicillins; Sulfur; and Tramadol   Review of Systems Review of Systems  Constitutional: Positive for fatigue. Negative for activity change and fever.  Respiratory: Positive for cough. Negative for shortness of breath.   Cardiovascular: Positive for chest pain.  Gastrointestinal: Positive for abdominal pain and constipation.  Neurological: Positive for headaches.  All other systems reviewed and are negative.    Physical Exam Updated Vital Signs BP (!) 154/67   Pulse 66   Temp 98.2 F (36.8 C) (Oral)   Resp 18   Ht 5\' 5"  (1.651 m)   Wt 60 kg (132 lb 4.4 oz)   SpO2 99%   BMI 22.01 kg/m   Physical Exam  Constitutional: She is oriented to person, place, and time. She appears well-developed and well-nourished.  HENT:  Head: Normocephalic and atraumatic.  Eyes: Right eye exhibits no discharge.  Cardiovascular: Normal rate, regular rhythm and normal heart sounds.  No murmur heard. Pulmonary/Chest: Effort normal and breath sounds normal. She has no wheezes. She has no rales.  Abdominal: Soft. She exhibits no distension. There is tenderness.  Right mid quadrant  Neurological: She is oriented to person, place, and time.  Skin: Skin is warm and dry. She is not diaphoretic.  Psychiatric: She has a normal mood and affect.  Nursing note and vitals reviewed.    ED Treatments / Results  Labs (all labs ordered are listed, but only abnormal results are displayed) Labs Reviewed  URINALYSIS, ROUTINE W REFLEX MICROSCOPIC - Abnormal; Notable for the following components:      Result Value   Specific Gravity, Urine <1.005 (*)    All other components within normal limits  COMPREHENSIVE METABOLIC PANEL - Abnormal; Notable for the following components:   Sodium 130 (*)    Chloride 97 (*)    Glucose, Bld 100 (*)    All other components within normal limits  CBC - Abnormal; Notable for the following  components:   RBC 3.83 (*)    All other components within normal limits  LIPASE, BLOOD    EKG  EKG Interpretation  Date/Time:  Monday September 23 2017 14:49:10 EST Ventricular Rate:  61 PR Interval:    QRS Duration: 96 QT Interval:  438 QTC Calculation: 442 R Axis:   58 Text Interpretation:  Sinus rhythm Abnormal R-wave progression, early transition Normal sinus rhythm Confirmed by Corlis Leak, Lashai Grosch ([id number]) on 09/23/2017 3:11:45 PM       Radiology Ct Abdomen Pelvis W Contrast  Result Date: 09/23/2017 CLINICAL DATA:  Right abdominal/flank pain x4 months EXAM: CT ABDOMEN AND PELVIS WITH CONTRAST TECHNIQUE: Multidetector CT imaging of the abdomen and pelvis was performed using the standard protocol following bolus administration of intravenous contrast. CONTRAST:  ISOVUE-300 IOPAMIDOL (ISOVUE-300) INJECTION 61% COMPARISON:  08/02/2006 FINDINGS: Lower chest: Mild subpleural scarring/ atelectasis lung bases. Hepatobiliary: Liver is within normal limits. Status post cholecystectomy. Very mild intrahepatic ductal prominence. Common duct measures 11 mm and smoothly tapers at the ampulla, likely postsurgical. Pancreas: Within normal limits. Spleen: Within normal limits. Adrenals/Urinary Tract: 13 mm left adrenal nodule (series 2/image 17), with greater than 40% washout on delayed imaging, compatible with a benign adrenal adenoma. Right adrenal glands within normal limits. Right kidneys within normal limits. 11 mm hyperdense lesion in the anterior left lower kidney (series 7/ image 25), without de-enhancement on delayed imaging, favoring a benign hemorrhagic cyst but technically indeterminate. Additional 4 mm left lower pole renal cyst. No hydronephrosis. Bladder is within normal limits. Stomach/Bowel: Stomach is within normal limits. No evidence of bowel obstruction. Appendix is not discretely visualized. Left colonic diverticulosis, without evidence of diverticulitis. Vascular/Lymphatic: No  evidence of abdominal aortic aneurysm. Atherosclerotic calcifications of the abdominal aorta and branch vessels. No suspicious abdominopelvic lymphadenopathy. Reproductive: Status post hysterectomy. Left ovary is within normal limits. 2.1 cm right ovarian cystic lesion (series 2/image 67). Other: No abdominopelvic ascites. Musculoskeletal: Mild degenerative changes at L3-4. IMPRESSION: No CT findings to account for the patient's chronic right abdominal pain. 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 11 mm probable hemorrhagic cyst in the anterior left lower kidney, although technically indeterminate. Consider follow-up CT abdomen with/ without contrast or MRI abdomen (renal protocol) with without contrast in 6 months for further characterization, as clinically warranted. 2.1 cm right ovarian cystic lesion, likely benign. In a postmenopausal patient, follow-up ultrasound is suggested in 1 year. Electronically Signed   By: Charline Bills M.D.   On: 09/23/2017 16:11    Procedures Procedures (including critical care time)  Medications Ordered in ED Medications  iopamidol (ISOVUE-300) 61 % injection 100 mL (100 mLs Intravenous Contrast Given 09/23/17 1544)     Initial Impression / Assessment and Plan / ED Course  I have reviewed the triage vital signs and the nursing notes.  Pertinent labs & imaging results that were available during my care of the patient were reviewed by me and considered in my medical decision making (see chart for details).     Very well-appearing 61 year old female presenting with vague abdominal complaints.  Will get CT to rule out any kind of intrapelvic process.  However suspect this is likely IBS versus anxiety driven bowel pain.  Will rule out emergencies, have her follow-up with her primary care physician for further care.  5:33 PM CT shows no emergent cause.  Patient eating drinking normally.  She states her discharge home.  We have given patient her results of her CAT scan to  follow-up with her primary care for additional imaging in the future.  Gave her the phone number for the [facility] follow-up for her chronic abdominal pain.   5:50 PM Patient has me look at ears.  Patent patient's right ear had some cerumen in it.  Try to remove it.  There is still difficult to see the patient cTM.  We will have her follow-up with primary.  Final Clinical Impressions(s) / ED Diagnoses   Final diagnoses:  Chronic abdominal pain    ED Discharge Orders        Ordered    senna-docusate (SENOKOT-S) 8.6-50 MG tablet  Daily at bedtime     09/23/17 1732       Tabor Bartram, Cindee Saltourteney Lyn, MD 09/23/17 1733    Abelino DerrickMackuen, Cyrah Mclamb Lyn, MD 09/23/17 1750

## 2017-09-23 NOTE — ED Notes (Signed)
Pt on monitor 

## 2017-09-23 NOTE — ED Triage Notes (Signed)
Abdominal pain 4 months. Her MD diagnosed her with fibromyalgia. She has diarrhea and constipation that comes and goes. She was given medications but was unable to take the medications.

## 2017-10-02 ENCOUNTER — Telehealth (HOSPITAL_BASED_OUTPATIENT_CLINIC_OR_DEPARTMENT_OTHER): Payer: Self-pay | Admitting: Emergency Medicine

## 2017-10-02 NOTE — Telephone Encounter (Signed)
Patient called and asked for information about how to have labs transfer to My Chart. The patient given suggestions and follow up information

## 2017-10-18 MED FILL — ATENOLOL 25 MG TABLET: 25 | 30 days supply | Qty: 30 | Fill #4

## 2017-10-18 MED FILL — ALPRAZolam 1 MG TABS: 1 | 20 days supply | Qty: 90 | Fill #4

## 2017-10-18 MED FILL — AMITRIPTYLINE HCL 10 MG TAB: 10 | 30 days supply | Qty: 30 | Fill #0

## 2017-11-18 MED FILL — AMITRIPTYLINE HCL 10 MG TAB: 10 | 30 days supply | Qty: 30 | Fill #1

## 2017-11-18 MED FILL — ATENOLOL 25 MG TABLET: 25 | 30 days supply | Qty: 30 | Fill #5

## 2017-11-18 MED FILL — ALPRAZolam 1 MG TABS: 1 | 20 days supply | Qty: 90 | Fill #5

## 2017-11-25 MED FILL — SIMVASTATIN 20 MG TABLET: 20 | 90 days supply | Qty: 90 | Fill #0 | Status: TO

## 2017-11-25 MED FILL — AZITHROMYCIN 250 MG TABLET: 250 | 5 days supply | Qty: 6 | Fill #0

## 2017-12-16 MED FILL — ATENOLOL 25 MG TABLET: 25 | 90 days supply | Qty: 90 | Fill #0 | Status: TO

## 2017-12-16 MED FILL — ALPRAZolam 1 MG TABS: 1 | 90 days supply | Qty: 135 | Fill #0

## 2018-01-16 MED FILL — ALPRAZolam 1 MG TABS: 1 | 30 days supply | Qty: 135 | Fill #1 | Status: TO

## 2018-02-24 MED FILL — ALPRAZolam 1 MG TABS: 1 | 30 days supply | Qty: 135 | Fill #0

## 2018-03-31 MED FILL — ALPRAZolam 1 MG TABS: 1 | 30 days supply | Qty: 135 | Fill #0

## 2018-04-30 MED FILL — ALPRAZolam 1 MG TABS: 1 | 30 days supply | Qty: 135 | Fill #1

## 2018-05-30 MED FILL — ALPRAZolam 1 MG TABS: 1 | 30 days supply | Qty: 135 | Fill #0

## 2018-07-02 MED FILL — ALPRAZolam 1 MG TABS: 1 | 30 days supply | Qty: 135 | Fill #1

## 2018-08-01 MED FILL — ALPRAZolam 1 MG TABS: 1 | 30 days supply | Qty: 135 | Fill #0

## 2018-08-29 MED FILL — ALPRAZolam 1 MG TABS: 1 | 30 days supply | Qty: 135 | Fill #1

## 2018-09-26 MED FILL — ATENOLOL 25 MG TABLET: 25 | 90 days supply | Qty: 90 | Fill #0

## 2018-09-29 MED FILL — ALPRAZolam 1 MG TABS: 1 | 30 days supply | Qty: 135 | Fill #0

## 2018-10-31 MED FILL — ALPRAZolam 1 MG TABS: 1 | 30 days supply | Qty: 135 | Fill #1

## 2018-11-27 IMAGING — CT CT ABD-PELV W/ CM
2 of 5 series · 16 of 46 positions shown, 18 images · IV contrast (APPLIED)
Comparison: 08/02/2006

CLINICAL DATA: Right abdominal/flank pain x4 months

EXAM:
CT ABDOMEN AND PELVIS WITH CONTRAST
TECHNIQUE: Multidetector CT imaging of the abdomen and pelvis was performed
using the standard protocol following bolus administration of
intravenous contrast.
CONTRAST:  100mL [id number] IOPAMIDOL (CZR99X-9BB) INJECTION 61%

[Series 2: axial st · axial · 0.79mm/px · z∈[+997,+1387]mm · 13 of 88 slices shown, 15 images]
[im 5/88  soft-tissue]
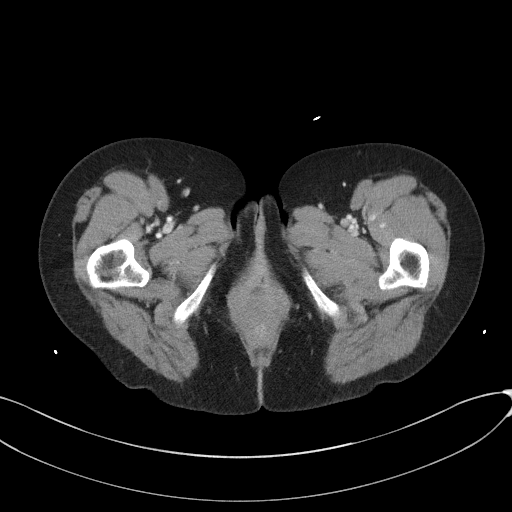
[im 5/88  bone]
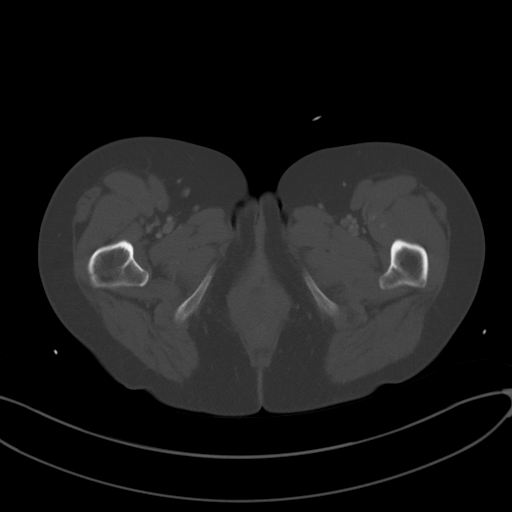
[im 10/88  soft-tissue]
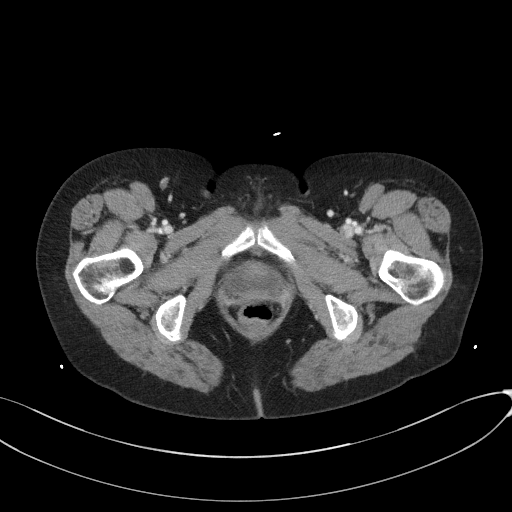
[im 20/88  soft-tissue]
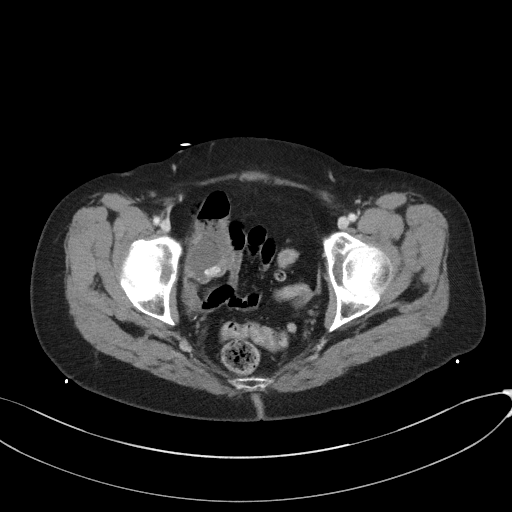
[im 25/88  soft-tissue]
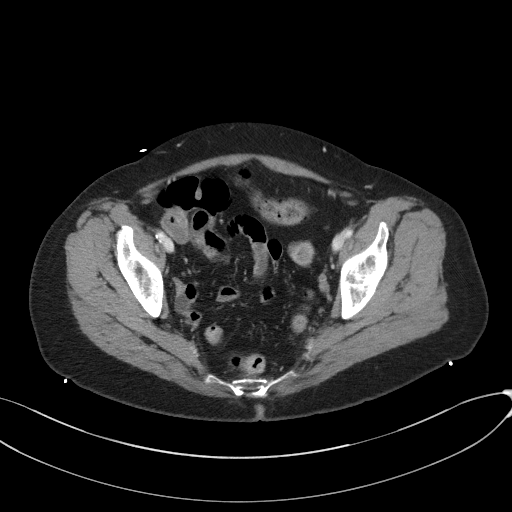
[im 30/88  soft-tissue]
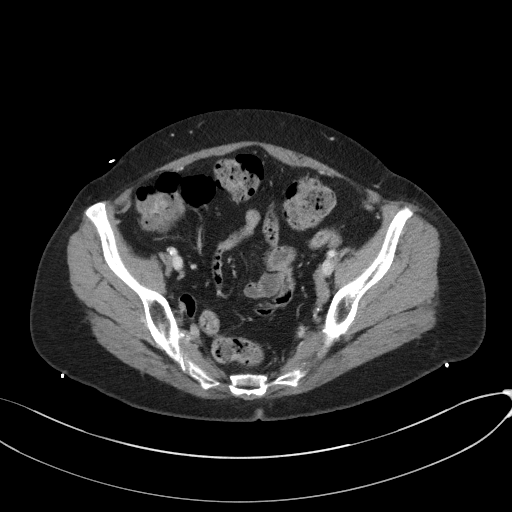
[im 39/88  soft-tissue]
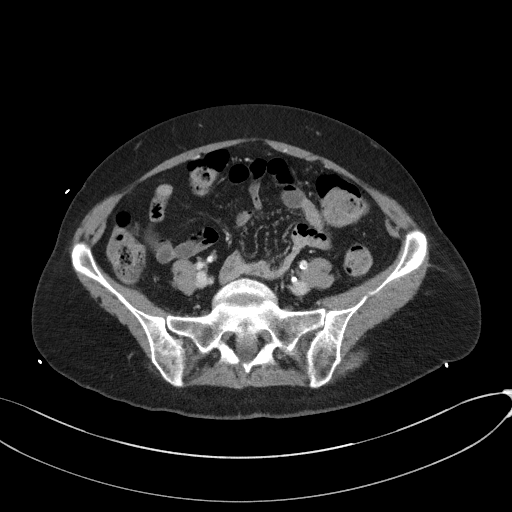
[im 44/88  soft-tissue]
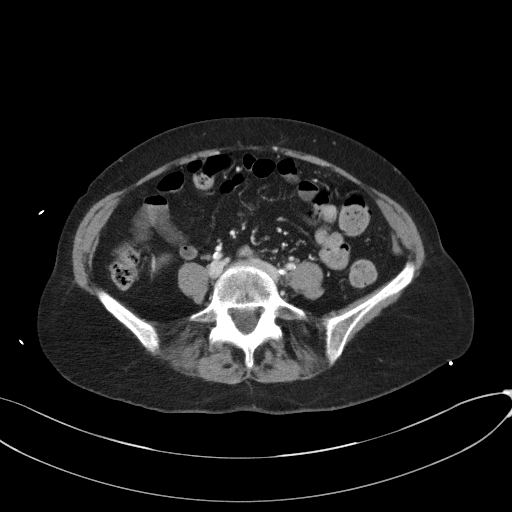
[im 49/88  soft-tissue]
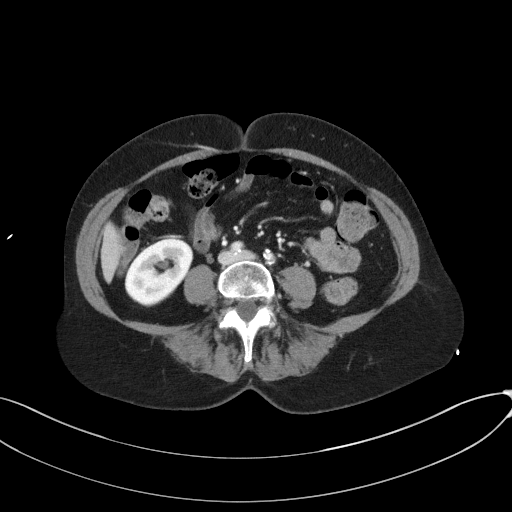
[im 59/88  soft-tissue]
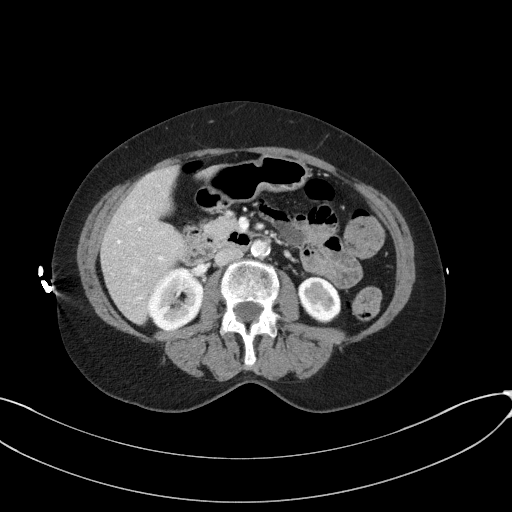
[im 59/88  bone]
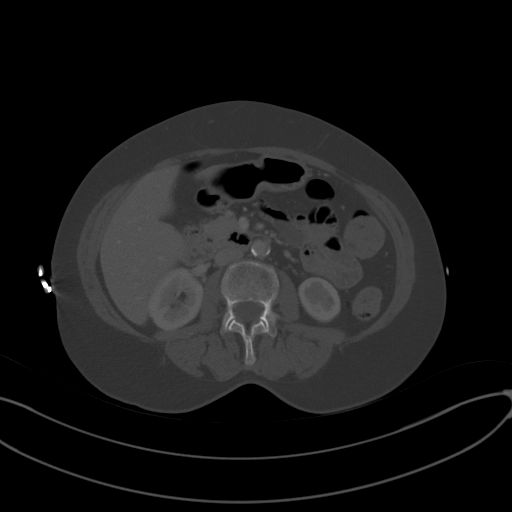
[im 63/88  soft-tissue]
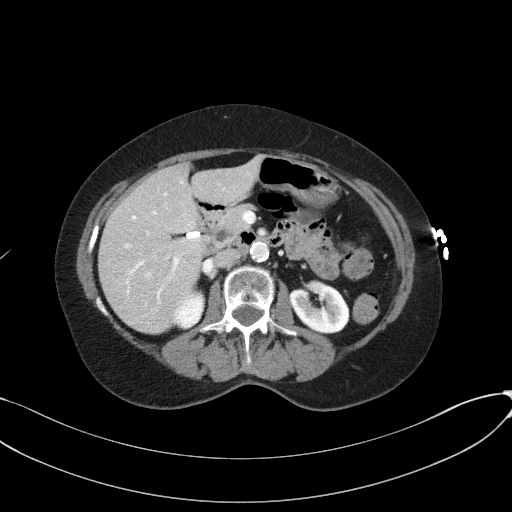
[im 68/88  soft-tissue]
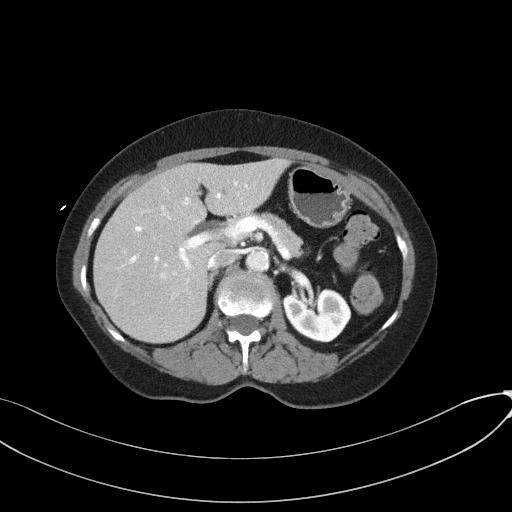
[im 78/88  soft-tissue]
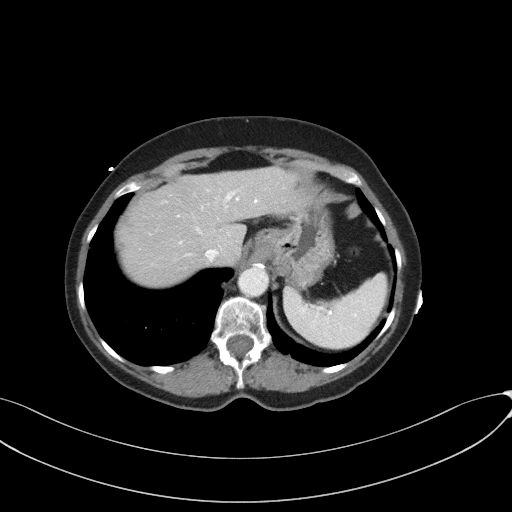
[im 83/88  soft-tissue]
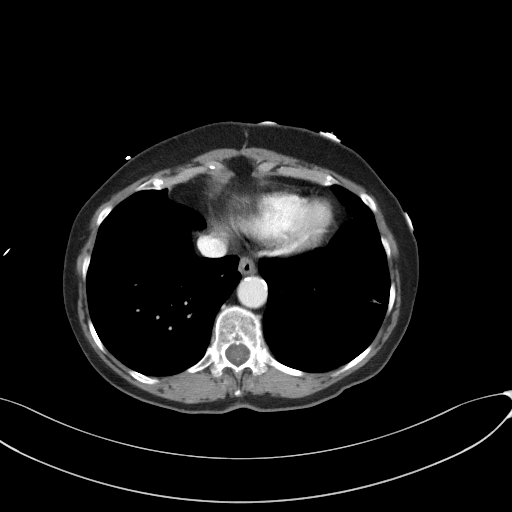

[Series 5: coronal st · coronal · 0.79mm/px · 3 of 78 slices shown]
[im 26/78  soft-tissue]
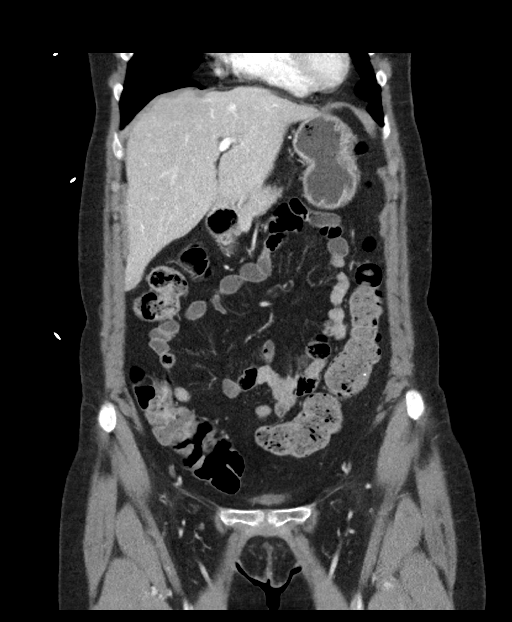
[im 35/78  soft-tissue]
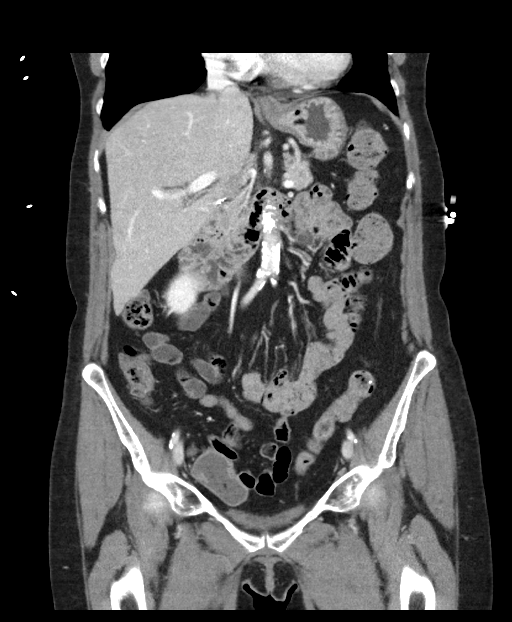
[im 43/78  soft-tissue]
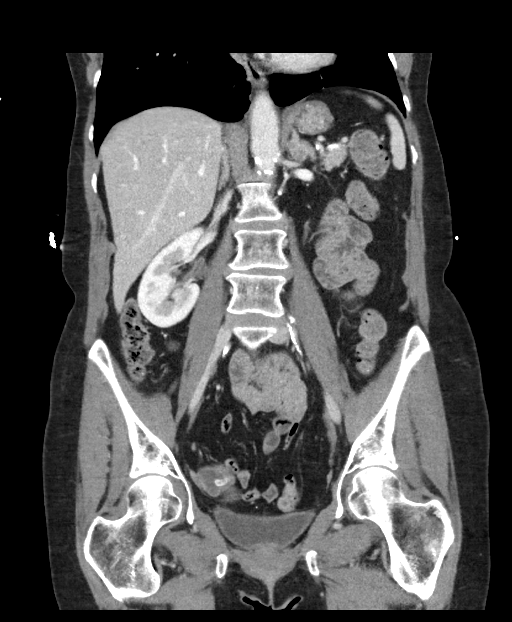

[16 of 46 positions shown; findings below may reference images not displayed]

FINDINGS: Lower chest: Mild subpleural scarring/ atelectasis lung bases.

Hepatobiliary: Liver is within normal limits.

Status post cholecystectomy.

Very mild intrahepatic ductal prominence. Common duct measures 11 mm
and smoothly tapers at the ampulla, likely postsurgical.

Pancreas: Within normal limits.

Spleen: Within normal limits.

Adrenals/Urinary Tract: 13 mm left adrenal nodule (series 2/image
17), with greater than 40% washout on delayed imaging, compatible
with a benign adrenal adenoma.

Right adrenal glands within normal limits.

Right kidneys within normal limits.

11 mm hyperdense lesion in the anterior left lower kidney (series 7/
image 25), without de-enhancement on delayed imaging, favoring a
benign hemorrhagic cyst but technically indeterminate. Additional 4
mm left lower pole renal cyst. No hydronephrosis.

Bladder is within normal limits.

Stomach/Bowel: Stomach is within normal limits.

No evidence of bowel obstruction.

Appendix is not discretely visualized.

Left colonic diverticulosis, without evidence of diverticulitis.

Vascular/Lymphatic: No evidence of abdominal aortic aneurysm.

Atherosclerotic calcifications of the abdominal aorta and branch
vessels.

No suspicious abdominopelvic lymphadenopathy.

Reproductive: Status post hysterectomy.

Left ovary is within normal limits.

2.1 cm right ovarian cystic lesion (series 2/image 67).

Other: No abdominopelvic ascites.

Musculoskeletal: Mild degenerative changes at L3-4.
IMPRESSION: No CT findings to account for the patient's chronic right abdominal
pain.

13 mm left adrenal adenoma.

11 mm probable hemorrhagic cyst in the anterior left lower kidney,
although technically indeterminate. Consider follow-up CT abdomen
with/ without contrast or MRI abdomen (renal protocol) with without
contrast in 6 months for further characterization, as clinically
warranted.

2.1 cm right ovarian cystic lesion, likely benign. In a
postmenopausal patient, follow-up ultrasound is suggested in 1 year.

## 2018-12-01 MED FILL — ALPRAZolam 1 MG TABS: 1 | 30 days supply | Qty: 135 | Fill #0

## 2018-12-01 MED FILL — ATENOLOL 25 MG TABLET: 25 | 90 days supply | Qty: 90 | Fill #0 | Status: TO

## 2018-12-30 MED FILL — ALPRAZolam 1 MG TABS: 1 | 30 days supply | Qty: 135 | Fill #1

## 2019-03-12 DIAGNOSIS — Z1159 Encounter for screening for other viral diseases: Secondary | ICD-10-CM | POA: Diagnosis not present

## 2019-03-16 DIAGNOSIS — R109 Unspecified abdominal pain: Secondary | ICD-10-CM | POA: Diagnosis not present

## 2019-03-16 DIAGNOSIS — R140 Abdominal distension (gaseous): Secondary | ICD-10-CM | POA: Diagnosis not present

## 2019-03-16 DIAGNOSIS — J209 Acute bronchitis, unspecified: Secondary | ICD-10-CM | POA: Diagnosis not present

## 2019-03-30 DIAGNOSIS — R140 Abdominal distension (gaseous): Secondary | ICD-10-CM | POA: Diagnosis not present

## 2019-03-30 DIAGNOSIS — Z1211 Encounter for screening for malignant neoplasm of colon: Secondary | ICD-10-CM | POA: Diagnosis not present

## 2019-03-30 DIAGNOSIS — R109 Unspecified abdominal pain: Secondary | ICD-10-CM | POA: Diagnosis not present

## 2019-04-13 DIAGNOSIS — R1084 Generalized abdominal pain: Secondary | ICD-10-CM | POA: Diagnosis not present

## 2019-04-27 DIAGNOSIS — K635 Polyp of colon: Secondary | ICD-10-CM | POA: Diagnosis not present

## 2019-04-27 DIAGNOSIS — Z1211 Encounter for screening for malignant neoplasm of colon: Secondary | ICD-10-CM | POA: Diagnosis not present

## 2019-04-27 DIAGNOSIS — Z01818 Encounter for other preprocedural examination: Secondary | ICD-10-CM | POA: Diagnosis not present

## 2019-04-29 DIAGNOSIS — K635 Polyp of colon: Secondary | ICD-10-CM | POA: Diagnosis not present

## 2019-05-15 DIAGNOSIS — Z1159 Encounter for screening for other viral diseases: Secondary | ICD-10-CM | POA: Diagnosis not present

## 2019-05-18 DIAGNOSIS — E739 Lactose intolerance, unspecified: Secondary | ICD-10-CM | POA: Diagnosis not present

## 2019-05-18 DIAGNOSIS — Z9189 Other specified personal risk factors, not elsewhere classified: Secondary | ICD-10-CM | POA: Diagnosis not present

## 2019-05-25 DIAGNOSIS — R0981 Nasal congestion: Secondary | ICD-10-CM | POA: Diagnosis not present

## 2019-05-25 DIAGNOSIS — M797 Fibromyalgia: Secondary | ICD-10-CM | POA: Diagnosis not present

## 2019-05-25 DIAGNOSIS — I10 Essential (primary) hypertension: Secondary | ICD-10-CM | POA: Diagnosis not present

## 2019-05-25 DIAGNOSIS — F419 Anxiety disorder, unspecified: Secondary | ICD-10-CM | POA: Diagnosis not present

## 2019-05-25 DIAGNOSIS — E7800 Pure hypercholesterolemia, unspecified: Secondary | ICD-10-CM | POA: Diagnosis not present

## 2019-12-14 DIAGNOSIS — F419 Anxiety disorder, unspecified: Secondary | ICD-10-CM | POA: Diagnosis not present

## 2019-12-14 DIAGNOSIS — I10 Essential (primary) hypertension: Secondary | ICD-10-CM | POA: Diagnosis not present

## 2019-12-14 DIAGNOSIS — E7800 Pure hypercholesterolemia, unspecified: Secondary | ICD-10-CM | POA: Diagnosis not present

## 2019-12-14 DIAGNOSIS — M797 Fibromyalgia: Secondary | ICD-10-CM | POA: Diagnosis not present

## 2019-12-14 DIAGNOSIS — R635 Abnormal weight gain: Secondary | ICD-10-CM | POA: Diagnosis not present

## 2019-12-14 DIAGNOSIS — J302 Other seasonal allergic rhinitis: Secondary | ICD-10-CM | POA: Diagnosis not present

## 2020-06-06 DIAGNOSIS — R109 Unspecified abdominal pain: Secondary | ICD-10-CM | POA: Diagnosis not present

## 2020-06-06 DIAGNOSIS — E7800 Pure hypercholesterolemia, unspecified: Secondary | ICD-10-CM | POA: Diagnosis not present

## 2020-06-06 DIAGNOSIS — M797 Fibromyalgia: Secondary | ICD-10-CM | POA: Diagnosis not present

## 2020-06-06 DIAGNOSIS — I10 Essential (primary) hypertension: Secondary | ICD-10-CM | POA: Diagnosis not present

## 2020-06-06 DIAGNOSIS — F419 Anxiety disorder, unspecified: Secondary | ICD-10-CM | POA: Diagnosis not present

## 2020-11-28 DIAGNOSIS — J019 Acute sinusitis, unspecified: Secondary | ICD-10-CM | POA: Diagnosis not present

## 2020-11-28 DIAGNOSIS — F419 Anxiety disorder, unspecified: Secondary | ICD-10-CM | POA: Diagnosis not present

## 2021-06-12 DIAGNOSIS — F411 Generalized anxiety disorder: Secondary | ICD-10-CM | POA: Diagnosis not present

## 2021-06-12 DIAGNOSIS — F419 Anxiety disorder, unspecified: Secondary | ICD-10-CM | POA: Diagnosis not present

## 2021-06-12 DIAGNOSIS — I10 Essential (primary) hypertension: Secondary | ICD-10-CM | POA: Diagnosis not present

## 2021-06-12 DIAGNOSIS — M797 Fibromyalgia: Secondary | ICD-10-CM | POA: Diagnosis not present

## 2021-06-22 DIAGNOSIS — E785 Hyperlipidemia, unspecified: Secondary | ICD-10-CM | POA: Diagnosis not present

## 2021-06-22 DIAGNOSIS — I2119 ST elevation (STEMI) myocardial infarction involving other coronary artery of inferior wall: Secondary | ICD-10-CM | POA: Diagnosis not present

## 2021-06-22 DIAGNOSIS — I70208 Unspecified atherosclerosis of native arteries of extremities, other extremity: Secondary | ICD-10-CM | POA: Diagnosis not present

## 2021-06-22 DIAGNOSIS — R9431 Abnormal electrocardiogram [ECG] [EKG]: Secondary | ICD-10-CM | POA: Diagnosis not present

## 2021-06-22 DIAGNOSIS — I10 Essential (primary) hypertension: Secondary | ICD-10-CM | POA: Diagnosis not present

## 2021-06-22 DIAGNOSIS — R072 Precordial pain: Secondary | ICD-10-CM | POA: Diagnosis not present

## 2021-06-22 DIAGNOSIS — Z955 Presence of coronary angioplasty implant and graft: Secondary | ICD-10-CM | POA: Diagnosis not present

## 2021-06-22 DIAGNOSIS — I340 Nonrheumatic mitral (valve) insufficiency: Secondary | ICD-10-CM | POA: Diagnosis not present

## 2021-06-22 DIAGNOSIS — I213 ST elevation (STEMI) myocardial infarction of unspecified site: Secondary | ICD-10-CM | POA: Diagnosis not present

## 2021-06-22 DIAGNOSIS — I2111 ST elevation (STEMI) myocardial infarction involving right coronary artery: Secondary | ICD-10-CM | POA: Diagnosis not present

## 2021-06-22 DIAGNOSIS — I7092 Chronic total occlusion of artery of the extremities: Secondary | ICD-10-CM | POA: Diagnosis not present

## 2021-06-22 DIAGNOSIS — I2510 Atherosclerotic heart disease of native coronary artery without angina pectoris: Secondary | ICD-10-CM | POA: Diagnosis not present

## 2021-06-22 DIAGNOSIS — I440 Atrioventricular block, first degree: Secondary | ICD-10-CM | POA: Diagnosis not present

## 2021-06-22 DIAGNOSIS — I959 Hypotension, unspecified: Secondary | ICD-10-CM | POA: Diagnosis not present

## 2021-06-23 DIAGNOSIS — I2510 Atherosclerotic heart disease of native coronary artery without angina pectoris: Secondary | ICD-10-CM | POA: Diagnosis not present

## 2021-06-23 DIAGNOSIS — I213 ST elevation (STEMI) myocardial infarction of unspecified site: Secondary | ICD-10-CM | POA: Diagnosis not present

## 2021-06-23 DIAGNOSIS — E785 Hyperlipidemia, unspecified: Secondary | ICD-10-CM | POA: Diagnosis not present

## 2021-06-23 DIAGNOSIS — Z955 Presence of coronary angioplasty implant and graft: Secondary | ICD-10-CM | POA: Diagnosis not present

## 2021-06-24 DIAGNOSIS — Z955 Presence of coronary angioplasty implant and graft: Secondary | ICD-10-CM | POA: Diagnosis not present

## 2021-06-24 DIAGNOSIS — I2510 Atherosclerotic heart disease of native coronary artery without angina pectoris: Secondary | ICD-10-CM | POA: Diagnosis not present

## 2021-06-24 DIAGNOSIS — I2111 ST elevation (STEMI) myocardial infarction involving right coronary artery: Secondary | ICD-10-CM | POA: Diagnosis not present

## 2021-07-25 DIAGNOSIS — Z9861 Coronary angioplasty status: Secondary | ICD-10-CM | POA: Diagnosis not present

## 2021-07-25 DIAGNOSIS — I219 Acute myocardial infarction, unspecified: Secondary | ICD-10-CM | POA: Diagnosis not present

## 2021-07-25 DIAGNOSIS — I252 Old myocardial infarction: Secondary | ICD-10-CM | POA: Diagnosis not present

## 2021-07-25 DIAGNOSIS — E785 Hyperlipidemia, unspecified: Secondary | ICD-10-CM | POA: Diagnosis not present

## 2021-07-25 DIAGNOSIS — I10 Essential (primary) hypertension: Secondary | ICD-10-CM | POA: Diagnosis not present

## 2021-07-31 DIAGNOSIS — I252 Old myocardial infarction: Secondary | ICD-10-CM | POA: Diagnosis not present

## 2021-12-11 DIAGNOSIS — J0110 Acute frontal sinusitis, unspecified: Secondary | ICD-10-CM | POA: Diagnosis not present

## 2021-12-11 DIAGNOSIS — I252 Old myocardial infarction: Secondary | ICD-10-CM | POA: Diagnosis not present

## 2021-12-11 DIAGNOSIS — R21 Rash and other nonspecific skin eruption: Secondary | ICD-10-CM | POA: Diagnosis not present

## 2021-12-11 DIAGNOSIS — F419 Anxiety disorder, unspecified: Secondary | ICD-10-CM | POA: Diagnosis not present

## 2022-01-23 DIAGNOSIS — I252 Old myocardial infarction: Secondary | ICD-10-CM | POA: Diagnosis not present

## 2022-01-23 DIAGNOSIS — Z79899 Other long term (current) drug therapy: Secondary | ICD-10-CM | POA: Diagnosis not present

## 2022-01-23 DIAGNOSIS — Z9861 Coronary angioplasty status: Secondary | ICD-10-CM | POA: Diagnosis not present

## 2022-01-23 DIAGNOSIS — E785 Hyperlipidemia, unspecified: Secondary | ICD-10-CM | POA: Diagnosis not present

## 2022-01-23 DIAGNOSIS — I10 Essential (primary) hypertension: Secondary | ICD-10-CM | POA: Diagnosis not present

## 2022-02-13 DIAGNOSIS — R0602 Shortness of breath: Secondary | ICD-10-CM | POA: Diagnosis not present

## 2022-02-13 DIAGNOSIS — K219 Gastro-esophageal reflux disease without esophagitis: Secondary | ICD-10-CM | POA: Diagnosis not present

## 2022-02-13 DIAGNOSIS — E278 Other specified disorders of adrenal gland: Secondary | ICD-10-CM | POA: Diagnosis not present

## 2022-02-13 DIAGNOSIS — I252 Old myocardial infarction: Secondary | ICD-10-CM | POA: Diagnosis not present

## 2022-02-13 DIAGNOSIS — R079 Chest pain, unspecified: Secondary | ICD-10-CM | POA: Diagnosis not present

## 2022-02-13 DIAGNOSIS — K760 Fatty (change of) liver, not elsewhere classified: Secondary | ICD-10-CM | POA: Diagnosis not present

## 2022-02-13 DIAGNOSIS — R197 Diarrhea, unspecified: Secondary | ICD-10-CM | POA: Diagnosis not present

## 2022-02-13 DIAGNOSIS — R0789 Other chest pain: Secondary | ICD-10-CM | POA: Diagnosis not present

## 2022-02-13 DIAGNOSIS — R9431 Abnormal electrocardiogram [ECG] [EKG]: Secondary | ICD-10-CM | POA: Diagnosis not present

## 2022-02-13 DIAGNOSIS — R112 Nausea with vomiting, unspecified: Secondary | ICD-10-CM | POA: Diagnosis not present

## 2022-02-13 DIAGNOSIS — R140 Abdominal distension (gaseous): Secondary | ICD-10-CM | POA: Diagnosis not present

## 2022-02-14 DIAGNOSIS — K760 Fatty (change of) liver, not elsewhere classified: Secondary | ICD-10-CM | POA: Diagnosis not present

## 2022-02-14 DIAGNOSIS — I2510 Atherosclerotic heart disease of native coronary artery without angina pectoris: Secondary | ICD-10-CM | POA: Diagnosis not present

## 2022-02-14 DIAGNOSIS — R112 Nausea with vomiting, unspecified: Secondary | ICD-10-CM | POA: Diagnosis not present

## 2022-02-14 DIAGNOSIS — I700 Atherosclerosis of aorta: Secondary | ICD-10-CM | POA: Diagnosis not present

## 2022-02-14 DIAGNOSIS — K219 Gastro-esophageal reflux disease without esophagitis: Secondary | ICD-10-CM | POA: Diagnosis not present

## 2022-02-14 DIAGNOSIS — R197 Diarrhea, unspecified: Secondary | ICD-10-CM | POA: Diagnosis not present

## 2022-02-14 DIAGNOSIS — R079 Chest pain, unspecified: Secondary | ICD-10-CM | POA: Diagnosis not present

## 2022-03-15 DIAGNOSIS — I25118 Atherosclerotic heart disease of native coronary artery with other forms of angina pectoris: Secondary | ICD-10-CM | POA: Diagnosis not present

## 2022-03-15 DIAGNOSIS — Z6825 Body mass index (BMI) 25.0-25.9, adult: Secondary | ICD-10-CM | POA: Diagnosis not present

## 2022-03-15 DIAGNOSIS — E663 Overweight: Secondary | ICD-10-CM | POA: Diagnosis not present

## 2022-03-15 DIAGNOSIS — Z0000 Encounter for general adult medical examination without abnormal findings: Secondary | ICD-10-CM | POA: Diagnosis not present

## 2022-03-15 DIAGNOSIS — I10 Essential (primary) hypertension: Secondary | ICD-10-CM | POA: Diagnosis not present

## 2022-03-15 DIAGNOSIS — I252 Old myocardial infarction: Secondary | ICD-10-CM | POA: Diagnosis not present

## 2022-03-15 DIAGNOSIS — Z79899 Other long term (current) drug therapy: Secondary | ICD-10-CM | POA: Diagnosis not present

## 2022-03-15 DIAGNOSIS — E559 Vitamin D deficiency, unspecified: Secondary | ICD-10-CM | POA: Diagnosis not present

## 2022-03-15 DIAGNOSIS — G4700 Insomnia, unspecified: Secondary | ICD-10-CM | POA: Diagnosis not present

## 2022-10-22 ENCOUNTER — Other Ambulatory Visit: Payer: Self-pay

## 2022-10-22 ENCOUNTER — Emergency Department (HOSPITAL_BASED_OUTPATIENT_CLINIC_OR_DEPARTMENT_OTHER): Payer: Medicare HMO

## 2022-10-22 ENCOUNTER — Emergency Department (HOSPITAL_BASED_OUTPATIENT_CLINIC_OR_DEPARTMENT_OTHER)
Admission: EM | Admit: 2022-10-22 | Discharge: 2022-10-22 | Disposition: A | Discharge status: Home / Self Care | Payer: Medicare HMO | Attending: Emergency Medicine | Admitting: Emergency Medicine

## 2022-10-22 ENCOUNTER — Encounter (HOSPITAL_BASED_OUTPATIENT_CLINIC_OR_DEPARTMENT_OTHER): Payer: Self-pay

## 2022-10-22 DIAGNOSIS — Z1152 Encounter for screening for COVID-19: Secondary | ICD-10-CM | POA: Diagnosis not present

## 2022-10-22 DIAGNOSIS — R1084 Generalized abdominal pain: Secondary | ICD-10-CM | POA: Diagnosis present

## 2022-10-22 DIAGNOSIS — K529 Noninfective gastroenteritis and colitis, unspecified: Secondary | ICD-10-CM | POA: Diagnosis not present

## 2022-10-22 DIAGNOSIS — R519 Headache, unspecified: Secondary | ICD-10-CM | POA: Insufficient documentation

## 2022-10-22 DIAGNOSIS — Z9049 Acquired absence of other specified parts of digestive tract: Secondary | ICD-10-CM | POA: Insufficient documentation

## 2022-10-22 DIAGNOSIS — K760 Fatty (change of) liver, not elsewhere classified: Secondary | ICD-10-CM | POA: Insufficient documentation

## 2022-10-22 DIAGNOSIS — R9431 Abnormal electrocardiogram [ECG] [EKG]: Secondary | ICD-10-CM | POA: Diagnosis not present

## 2022-10-22 DIAGNOSIS — Z780 Asymptomatic menopausal state: Secondary | ICD-10-CM | POA: Insufficient documentation

## 2022-10-22 LAB — CBC
HCT: 33.7 % — ABNORMAL LOW (ref 36.0–46.0)
Hemoglobin: 11.9 g/dL — ABNORMAL LOW (ref 12.0–15.0)
MCH: 32.2 pg (ref 26.0–34.0)
MCHC: 35.3 g/dL (ref 30.0–36.0)
MCV: 91.3 fL (ref 80.0–100.0)
Platelets: 215 10*3/uL (ref 150–400)
RBC: 3.69 MIL/uL — ABNORMAL LOW (ref 3.87–5.11)
RDW: 11.6 % (ref 11.5–15.5)
WBC: 8.5 10*3/uL (ref 4.0–10.5)
nRBC: 0 % (ref 0.0–0.2)

## 2022-10-22 LAB — URINALYSIS, ROUTINE W REFLEX MICROSCOPIC
Bilirubin Urine: NEGATIVE
Glucose, UA: NEGATIVE mg/dL
Hgb urine dipstick: NEGATIVE
Ketones, ur: NEGATIVE mg/dL
Leukocytes,Ua: NEGATIVE
Nitrite: NEGATIVE
Protein, ur: NEGATIVE mg/dL
Specific Gravity, Urine: 1.01 (ref 1.005–1.030)
pH: 7 (ref 5.0–8.0)

## 2022-10-22 LAB — LIPASE, BLOOD: Lipase: 27 U/L (ref 11–51)

## 2022-10-22 LAB — BASIC METABOLIC PANEL
Anion gap: 9 (ref 5–15)
BUN: 7 mg/dL — ABNORMAL LOW (ref 8–23)
CO2: 25 mmol/L (ref 22–32)
Calcium: 8.7 mg/dL — ABNORMAL LOW (ref 8.9–10.3)
Chloride: 96 mmol/L — ABNORMAL LOW (ref 98–111)
Creatinine, Ser: 0.69 mg/dL (ref 0.44–1.00)
GFR, Estimated: >60 mL/min (ref >60)
Glucose, Bld: 94 mg/dL (ref 70–99)
Potassium: 3.5 mmol/L (ref 3.5–5.1)
Sodium: 130 mmol/L — ABNORMAL LOW (ref 135–145)

## 2022-10-22 LAB — HEPATIC FUNCTION PANEL
ALT: 69 U/L — ABNORMAL HIGH (ref 0–44)
AST: 51 U/L — ABNORMAL HIGH (ref 15–41)
Albumin: 4.2 g/dL (ref 3.5–5.0)
Alkaline Phosphatase: 103 U/L (ref 38–126)
Bilirubin, Direct: <0.1 mg/dL (ref 0.0–0.2)
Total Bilirubin: 0.6 mg/dL (ref 0.3–1.2)
Total Protein: 7.7 g/dL (ref 6.5–8.1)

## 2022-10-22 LAB — RESP PANEL BY RT-PCR (RSV, FLU A&B, COVID)  RVPGX2
Influenza A by PCR: NEGATIVE
Influenza B by PCR: NEGATIVE
Resp Syncytial Virus by PCR: NEGATIVE
SARS Coronavirus 2 by RT PCR: NEGATIVE

## 2022-10-22 LAB — LACTIC ACID, PLASMA: Lactic Acid, Venous: 1.5 mmol/L (ref 0.5–1.9)

## 2022-10-22 LAB — TROPONIN I (HIGH SENSITIVITY)
Troponin I (High Sensitivity): 3 ng/L (ref <18)
Troponin I (High Sensitivity): 4 ng/L (ref <18)

## 2022-10-22 MED ORDER — CIPROFLOXACIN HCL 500 MG PO TABS: 500.0000 mg | ORAL_TABLET | Freq: Two times a day (BID) | ORAL | 0 refills | Status: AC

## 2022-10-22 MED ORDER — PROCHLORPERAZINE EDISYLATE 10 MG/2ML IJ SOLN
10.0000 mg | Freq: Once | INTRAMUSCULAR | Status: AC
  Administered 2022-10-22: 10 mg via INTRAVENOUS
  Filled 2022-10-22: qty 2

## 2022-10-22 MED ORDER — FENTANYL CITRATE PF 50 MCG/ML IJ SOSY
50.0000 ug | PREFILLED_SYRINGE | Freq: Once | INTRAMUSCULAR | Status: AC
  Administered 2022-10-22: 50 ug via INTRAVENOUS
  Filled 2022-10-22: qty 1

## 2022-10-22 MED ORDER — IOHEXOL 300 MG/ML  SOLN
100.0000 mL | Freq: Once | INTRAMUSCULAR | Status: AC | PRN
  Administered 2022-10-22: 100 mL via INTRAVENOUS

## 2022-10-22 MED ORDER — METRONIDAZOLE 500 MG PO TABS: 500.0000 mg | ORAL_TABLET | Freq: Three times a day (TID) | ORAL | 0 refills | Status: AC

## 2022-10-22 MED ORDER — DIPHENHYDRAMINE HCL 50 MG/ML IJ SOLN
50.0000 mg | Freq: Once | INTRAMUSCULAR | Status: AC
  Administered 2022-10-22: 50 mg via INTRAVENOUS
  Filled 2022-10-22: qty 1

## 2022-10-22 MED ORDER — SODIUM CHLORIDE 0.9 % IV BOLUS
1000.0000 mL | Freq: Once | INTRAVENOUS | Status: AC
  Administered 2022-10-22: 1000 mL via INTRAVENOUS

## 2022-10-22 NOTE — ED Triage Notes (Signed)
Pt c/o CP "off & on all week, got really bad today." Also c/o HA "off & on all week too, but today it's been the hardest pain." Associated nausea, no vomiting. Hx stent, anxiety  500mg  tylenol last night

## 2022-10-22 NOTE — Discharge Instructions (Addendum)
Your history, exam, workup today revealed evidence of ascending and transverse colitis likely causing your abdominal pain and GI symptoms.  The CT scan did show the right ovarian cyst which you need to follow-up with your primary doctor to discuss getting outpatient pelvic ultrasound as we discussed.  Please rest and stay hydrated and follow-up with your regular doctor.  Your chest x-ray and cardiac workup are reassuring.  If any symptoms change or worsen acutely, please return to the nearest emergency department.  You may also want to follow-up with your GI doctor.

## 2022-10-22 NOTE — ED Provider Notes (Signed)
[facility] AT [facility] Provider Note   CSN: [id number] Arrival date & time: 10/22/22  1343     History  Chief Complaint  Patient presents with   Chest Pain   Nausea   Headache    Sheryl Stone is a 67 y.o. female.  The history is provided by the patient and medical records. No language interpreter was used.  Chest Pain Pain location:  Epigastric Pain quality: aching, burning and dull   Associated symptoms: abdominal pain, fatigue, headache and nausea   Associated symptoms: no back pain, no cough, no diaphoresis, no fever, no numbness, no palpitations, no shortness of breath, no vomiting and no weakness   Headache Associated symptoms: abdominal pain, diarrhea, fatigue and nausea   Associated symptoms: no back pain, no congestion, no cough, no fever, no neck pain, no neck stiffness, no numbness, no vomiting and no weakness   Abdominal Pain Pain location:  Generalized Pain quality: aching, burning, cramping and fullness   Pain radiates to:  Chest, R flank and L flank Pain severity:  Severe Onset quality:  Gradual Duration:  6 days Timing:  Constant Progression:  Waxing and waning Chronicity:  New Context: previous surgery   Context: not trauma   Relieved by:  Nothing Worsened by:  Palpation Ineffective treatments:  None tried Associated symptoms: chest pain, chills, constipation, diarrhea, fatigue and nausea   Associated symptoms: no cough, no dysuria, no fever, no shortness of breath and no vomiting        Home Medications Prior to Admission medications   Medication Sig Start Date End Date Taking? Authorizing Provider  ALPRAZolam Prudy Feeler) 0.25 MG tablet Take 0.25 mg by mouth at bedtime as needed for anxiety.    [provider]  aspirin 81 MG tablet Take 81 mg by mouth daily.    [provider]  ATENOLOL PO Take by mouth.    [provider]  Cholecalciferol ([employer] VIT D 5000 HIGH-POTENCY PO) Take by  mouth.    [provider]  Multiple Vitamin (MULTIVITAMIN) tablet Take 1 tablet by mouth daily.    [provider]  senna-docusate (SENOKOT-S) 8.6-50 MG tablet Take 1 tablet by mouth at bedtime. 09/23/17   Mackuen, Courteney Lyn, MD      Allergies    Codeine, Elemental sulfur, Penicillins, and Tramadol    Review of Systems   Review of Systems  Constitutional:  Positive for chills and fatigue. Negative for diaphoresis and fever.  HENT:  Negative for congestion.   Eyes:  Negative for visual disturbance.  Respiratory:  Negative for cough, chest tightness, shortness of breath and wheezing.   Cardiovascular:  Positive for chest pain. Negative for palpitations and leg swelling.  Gastrointestinal:  Positive for abdominal pain, constipation, diarrhea and nausea. Negative for abdominal distention and vomiting.  Genitourinary:  Negative for dysuria, flank pain and frequency.  Musculoskeletal:  Negative for back pain, neck pain and neck stiffness.  Skin:  Negative for rash and wound.  Neurological:  Positive for headaches. Negative for weakness, light-headedness and numbness.  Psychiatric/Behavioral:  Negative for agitation and confusion.   All other systems reviewed and are negative.   Physical Exam Updated Vital Signs BP 123/82 (BP Location: Right Arm)   Pulse 74   Temp 98.2 F (36.8 C) (Oral)   Resp 20   Ht 5\' 5"  (1.651 m)   Wt 67.1 kg   SpO2 97%   BMI 24.63 kg/m  Physical Exam Vitals and nursing note reviewed.  Constitutional:      General: She is not in acute distress.    Appearance: She is well-developed. She is not ill-appearing, toxic-appearing or diaphoretic.  HENT:     Head: Normocephalic and atraumatic.     Nose: Nose normal. No congestion or rhinorrhea.     Mouth/Throat:     Mouth: Mucous membranes are dry.     Pharynx: No oropharyngeal exudate or posterior oropharyngeal erythema.  Eyes:     Extraocular Movements: Extraocular movements intact.      Conjunctiva/sclera: Conjunctivae normal.  Cardiovascular:     Rate and Rhythm: Normal rate and regular rhythm.     Heart sounds: No murmur heard. Pulmonary:     Effort: Pulmonary effort is normal. No respiratory distress.     Breath sounds: Normal breath sounds. No rhonchi.  Chest:     Chest wall: No tenderness.  Abdominal:     General: Abdomen is flat. There is no distension.     Palpations: Abdomen is soft.     Tenderness: There is abdominal tenderness. There is right CVA tenderness and left CVA tenderness. There is no guarding or rebound.  Musculoskeletal:        General: No swelling.     Cervical back: Neck supple. No tenderness.     Right lower leg: No tenderness. No edema.     Left lower leg: No tenderness. No edema.  Skin:    General: Skin is warm and dry.     Capillary Refill: Capillary refill takes less than 2 seconds.     Coloration: Skin is not pale.     Findings: No erythema or rash.  Neurological:     Mental Status: She is alert.     Sensory: No sensory deficit.     Motor: No weakness.  Psychiatric:        Mood and Affect: Mood normal.     ED Results / Procedures / Treatments   Labs (all labs ordered are listed, but only abnormal results are displayed) Labs Reviewed  BASIC METABOLIC PANEL - Abnormal; Notable for the following components:      Result Value   Sodium 130 (*)    Chloride 96 (*)    BUN 7 (*)    Calcium 8.7 (*)    All other components within normal limits  CBC - Abnormal; Notable for the following components:   RBC 3.69 (*)    Hemoglobin 11.9 (*)    HCT 33.7 (*)    All other components within normal limits  HEPATIC FUNCTION PANEL - Abnormal; Notable for the following components:   AST 51 (*)    ALT 69 (*)    All other components within normal limits  URINALYSIS, ROUTINE W REFLEX MICROSCOPIC - Abnormal; Notable for the following components:   Color, Urine STRAW (*)    All other components within normal limits  RESP PANEL BY RT-PCR (RSV,  FLU A&B, COVID)  RVPGX2  LIPASE, BLOOD  LACTIC ACID, PLASMA  TROPONIN I (HIGH SENSITIVITY)  TROPONIN I (HIGH SENSITIVITY)    EKG EKG Interpretation  Date/Time:  Monday October 22 2022 13:54:04 EST Ventricular Rate:  65 PR Interval:  206 QRS Duration: 98 QT Interval:  436 QTC Calculation: 454 R Axis:   12 Text Interpretation: Sinus rhythm Abnormal R-wave progression, early transition Borderline T abnormalities, anterior leads when compared to prior, new t wave ivnersionin leads 3 and V3. No STEMI Confirmed by Theda Belfast ([id number]) on 10/22/2022 3:33:57 PM  Radiology CT ABDOMEN  PELVIS W CONTRAST  Result Date: 10/22/2022 CLINICAL DATA:  Chest and abdominal discomfort with right upper quadrant and suprapubic pain. Nausea but no vomiting. Previous cholecystectomy and hysterectomy. EXAM: CT ABDOMEN AND PELVIS WITH CONTRAST TECHNIQUE: Multidetector CT imaging of the abdomen and pelvis was performed using the standard protocol following bolus administration of intravenous contrast. RADIATION DOSE REDUCTION: This exam was performed according to the departmental dose-optimization program which includes automated exposure control, adjustment of the mA and/or kV according to patient size and/or use of iterative reconstruction technique. CONTRAST:  OMNIPAQUE IOHEXOL 300 MG/ML  SOLN COMPARISON:  Numerous prior CT scans including 07/16/2022 and older FINDINGS: Lower chest: There is some bandlike changes in the left lung base. Atelectasis is favored over infiltrate. Possible scarring as well. No pleural effusion. Coronary artery calcifications are seen. Hepatobiliary: Fatty liver infiltration. There is a lobular area of slightly increased density posteriorly in segment 4, unchanged from previous. This has a geographic appearance and could represent some fatty sparing. This is unchanged from prior examination. Previous cholecystectomy. There is mild ectasia of the biliary tree down to the ampulla.  Diameter of duct approaches 11 mm and is similar to a study of 2018. Pancreas: No pancreatic mass or ductal dilatation. Spleen: Spleen is nonenlarged. Adrenals/Urinary Tract: Preserved right adrenal gland. There is a left adrenal nodule, unchanged from study of 2018 measuring 13 mm. Previously described as an adenoma. No enhancing renal mass or collecting system dilatation. 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 Ureters have normal course and caliber down to the bladder. Preserved contours of the mildly distended urinary bladder. Stomach/Bowel: Few sigmoid colon diverticula identified. Large bowel has a normal course and caliber with scattered stool. Appendix not clearly seen in the right lower quadrant near the cecum. No specific pericecal stranding or fluid. Questionable wall thickening along portions of the ascending and transverse colon. Subtle wall enhancement. Please correlate for any clinical evidence of colitis. Preserved terminal ileum. The small bowel is nondilated. Vascular/Lymphatic: Normal caliber aorta and IVC with diffuse vascular calcifications along the aorta and branch vessels. There are areas of significant stenosis suggested along both common iliac arteries. Please correlate with any specific symptomatology. There are some noncalcified plaque as well along the SMA. No specific abnormal lymph node enlargement identified in the abdomen and pelvis. Reproductive: Uterus is absent. Postmenopausal left ovary identified. There is a complex cystic area in the right adnexa. Dominant focus on the prior examination measured 2.7 x 2.2 cm and today on series 2, image 70 dominant focus would measure proximally 2.9 by 2.6 cm. Actually similar when adjusting for measurement variation and technique. There is however a new cystic focus along the caudal aspect of the  right ovary measuring 2 cm on series 2, image 66. Other: No ascites. No anterior abdominal or pelvic wall hernia clearly identified. Musculoskeletal: Scattered degenerative changes along the spine and pelvis. Areas of disc bulging identified in the lumbar spine. IMPRESSION: Subtle wall thickening suggested along the ascending colon and transverse colon with some mural enhancement. It is uncertain if this related to the level of distention. Please correlate for any clinical evidence of a colitis. No obstruction, free air or free fluid. Fatty liver infiltration with stable ectasia of the biliary tree going back to 2018. Patient is status post cholecystectomy. Prominent vascular calcifications with areas of stenosis suggested along the iliac vessels. Please correlate with  any symptomatology. Persistent cystic focus in the right adnexa, ovary. There is increasing component inferiorly. With this changes would recommend further workup such as pelvic ultrasound when clinically appropriate as the patient is postmenopausal. Electronically Signed   By: Jill Side M.D.   On: 10/22/2022 17:33   DG Chest 2 View  Result Date: 10/22/2022 CLINICAL DATA:  Chest pain for 1 week. EXAM: CHEST - 2 VIEW COMPARISON:  April 29, 2022 FINDINGS: The heart size and mediastinal contours are stable. Both lungs are clear. The visualized skeletal structures are unremarkable. IMPRESSION: No active cardiopulmonary disease. Electronically Signed   By: Abelardo Diesel M.D.   On: 10/22/2022 14:23    Procedures Procedures    Medications Ordered in ED Medications  sodium chloride 0.9 % bolus 1,000 mL (0 mLs Intravenous Stopped 10/22/22 1822)  prochlorperazine (COMPAZINE) injection 10 mg (10 mg Intravenous Given 10/22/22 1629)  diphenhydrAMINE (BENADRYL) injection 50 mg (50 mg Intravenous Given 10/22/22 1628)  fentaNYL (SUBLIMAZE) injection 50 mcg (50 mcg Intravenous Given 10/22/22 1629)  iohexol (OMNIPAQUE) 300 MG/ML solution 100 mL (100 mLs  Intravenous Contrast Given 10/22/22 1708)    ED Course/ Medical Decision Making/ A&P                             Medical Decision Making Amount and/or Complexity of Data Reviewed Labs: ordered. Radiology: ordered.  Risk Prescription drug management.    Haja Crego is a 67 y.o. female with a past medical history significant for myalgia, hypertension, previous diverticulosis, partial hysterectomy, reported previous cholecystectomy, and chronic back pain who presents with 6 days of nausea, decreased oral intake, darkened urine, abdominal pain, initial diarrhea that his changed to constipation with no bowel movement for the last few days, lower chest discomfort, and headache.  According to patient, all the symptoms started with her nausea and abdominal discomfort that she describes up to 10 out of 10 at times.  It is now a 7 out of 10 and aching all over.  She reports it wraps around towards her back but does not stab straight through.  She reports the aching also goes towards her chest and feels like gas.  She reports with the decreased oral intake she has had some headache with light sensitivity and sound sensitivity.  She denies any focal neurologic complaints or deficits.  She reports no cough, congestion, or shortness of breath and denies any trauma.  No rashes reported.  She is concerned about "diverticulitis, kidney infection, or urine infection".  On my exam, lungs were clear.  There was no rhonchi or rales.  No wheezing.  Chest was nontender and there was no murmur.  Abdomen was diffusely tender although I did hear some bowel sounds.  She had intact pulses in all extremities.  Midline back was nontender but she had some paraspinal back tenderness over the CVA areas bilaterally.  No neck tenderness.  No focal neurologic deficits.  Pupils are symmetric and reactive with normal extract movements.  Mouth and mucous membranes appear dry.  Clinically I suspect patient may have a viral  gastroenteritis causing nausea abdominal discomfort, and diarrhea that has been sweeping to the community.  With her report that she has sensitive bowels already this could be the driver of her symptoms leading to dehydration and headache and fatigue.  We will however get a CT scan to rule out diverticulitis or other partial structure and as she has had previous abdominal surgeries.  We will get urinalysis to look for UTI or pyelonephritis given the pain going towards her flanks with nausea.  We will give her some fluids and a combination of headache cocktail and some pain medicine to help with her symptoms.  She had some screening workup in triage including a negative viral panel in regards to COVID, flu, and RSV.  Her initial troponin was negative, will trend.  Her chest discomfort feels like a more burning aching in her low central chest and not on the left side and seems to be when she is having the abdominal discomfort.  Seems less likely cardiac in description at this time.  Anticipate reassessment after workup is completed to determine disposition.  7:50 PM CT scan showed evidence of colitis in her ascending and transverse colon this is likely to explain her symptoms.  We discussed the findings that there was a right ovarian cyst that seems to have grown compared to last time but she does not want to do a pelvic ultrasound or exam today.  She will follow-up with her PCP for that she reports.  Will do a p.o. challenge but if this is reassuring and she passes, we will get her prescription for Cipro and Flagyl as she says she cannot take penicillins and has nausea medicine at home.  She will take antibiotics for the colitis and will follow-up with PCP.  She no other questions or concerns and anticipate discharge.  Patient be discharged for outpatient antibiotic treatment of the colitis and follow-up with PCP.  Patient agrees with this plan.        Final Clinical Impression(s) / ED  Diagnoses Final diagnoses:  Colitis  Generalized abdominal pain    Rx / DC Orders ED Discharge Orders          Ordered    ciprofloxacin (CIPRO) 500 MG tablet  2 times daily        10/22/22 2009    metroNIDAZOLE (FLAGYL) 500 MG tablet  3 times daily        10/22/22 2009           Clinical Impression: 1. Colitis   2. Generalized abdominal pain     Disposition: Discharge  Condition: Good  I have discussed the results, Dx and Tx plan with the pt(& family if present). He/she/they expressed understanding and agree(s) with the plan. Discharge instructions discussed at great length. Strict return precautions discussed and pt &/or family have verbalized understanding of the instructions. No further questions at time of discharge.    New Prescriptions   CIPROFLOXACIN (CIPRO) 500 MG TABLET    Take 1 tablet (500 mg total) by mouth 2 (two) times daily for 10 days.   METRONIDAZOLE (FLAGYL) 500 MG TABLET    Take 1 tablet (500 mg total) by mouth 3 (three) times daily for 10 days.    Follow Up: your PCP and GI doctor        Taressa Rauh, Gwenyth Allegra, MD 10/22/22 2015

## 2022-10-22 NOTE — ED Notes (Addendum)
Pt offered po fluids and saltines

## 2022-10-22 NOTE — ED Notes (Signed)
Client is a HIGH FALL RISK due to IV meds administered. SR x 2 up, call bell within easy reach, pt instructed not to get up from stretcher without staff in room. Remains on cont cardiac monitoring.

## 2022-10-22 NOTE — ED Notes (Signed)
Here for complaints of chest discomfort and abd pain, points to RUQ and suprapubic area, states has had some nausea, but no vomiting. Cardiac monitor indicates NSR without ectopy at this time.

## 2022-10-23 ENCOUNTER — Telehealth (HOSPITAL_BASED_OUTPATIENT_CLINIC_OR_DEPARTMENT_OTHER): Payer: Self-pay

## 2022-10-23 NOTE — Telephone Encounter (Signed)
Patient called this RN stating that Dr. Sherry Ruffing had prescribed her 2 antibiotics and said he would also prescribe 2 pills of diflucan. States there is no prescription for Diflucan. Requesting prescription to be sent to pharmacy. Will speak with provider

## 2023-12-19 ENCOUNTER — Encounter (HOSPITAL_BASED_OUTPATIENT_CLINIC_OR_DEPARTMENT_OTHER): Payer: Self-pay

## 2023-12-19 ENCOUNTER — Emergency Department (HOSPITAL_BASED_OUTPATIENT_CLINIC_OR_DEPARTMENT_OTHER)

## 2023-12-19 ENCOUNTER — Emergency Department (HOSPITAL_BASED_OUTPATIENT_CLINIC_OR_DEPARTMENT_OTHER)
Admission: EM | Admit: 2023-12-19 | Discharge: 2023-12-19 | Discharge status: Against Medical Advice | Attending: Emergency Medicine | Admitting: Emergency Medicine

## 2023-12-19 ENCOUNTER — Other Ambulatory Visit: Payer: Self-pay

## 2023-12-19 DIAGNOSIS — Y9301 Activity, walking, marching and hiking: Secondary | ICD-10-CM | POA: Insufficient documentation

## 2023-12-19 DIAGNOSIS — Z79899 Other long term (current) drug therapy: Secondary | ICD-10-CM | POA: Insufficient documentation

## 2023-12-19 DIAGNOSIS — I10 Essential (primary) hypertension: Secondary | ICD-10-CM | POA: Diagnosis not present

## 2023-12-19 DIAGNOSIS — R7401 Elevation of levels of liver transaminase levels: Secondary | ICD-10-CM | POA: Diagnosis not present

## 2023-12-19 DIAGNOSIS — Z91040 Latex allergy status: Secondary | ICD-10-CM | POA: Insufficient documentation

## 2023-12-19 DIAGNOSIS — Z5329 Procedure and treatment not carried out because of patient's decision for other reasons: Secondary | ICD-10-CM | POA: Insufficient documentation

## 2023-12-19 DIAGNOSIS — M797 Fibromyalgia: Secondary | ICD-10-CM | POA: Diagnosis not present

## 2023-12-19 DIAGNOSIS — R296 Repeated falls: Secondary | ICD-10-CM | POA: Insufficient documentation

## 2023-12-19 DIAGNOSIS — Z7982 Long term (current) use of aspirin: Secondary | ICD-10-CM | POA: Insufficient documentation

## 2023-12-19 DIAGNOSIS — Y92019 Unspecified place in single-family (private) house as the place of occurrence of the external cause: Secondary | ICD-10-CM | POA: Diagnosis not present

## 2023-12-19 DIAGNOSIS — S80211A Abrasion, right knee, initial encounter: Secondary | ICD-10-CM | POA: Insufficient documentation

## 2023-12-19 DIAGNOSIS — I6782 Cerebral ischemia: Secondary | ICD-10-CM | POA: Insufficient documentation

## 2023-12-19 DIAGNOSIS — W1830XA Fall on same level, unspecified, initial encounter: Secondary | ICD-10-CM | POA: Insufficient documentation

## 2023-12-19 DIAGNOSIS — M25561 Pain in right knee: Secondary | ICD-10-CM | POA: Diagnosis not present

## 2023-12-19 DIAGNOSIS — S6991XA Unspecified injury of right wrist, hand and finger(s), initial encounter: Secondary | ICD-10-CM | POA: Diagnosis present

## 2023-12-19 DIAGNOSIS — S6291XA Unspecified fracture of right wrist and hand, initial encounter for closed fracture: Secondary | ICD-10-CM | POA: Insufficient documentation

## 2023-12-19 DIAGNOSIS — N179 Acute kidney failure, unspecified: Secondary | ICD-10-CM | POA: Insufficient documentation

## 2023-12-19 DIAGNOSIS — R748 Abnormal levels of other serum enzymes: Secondary | ICD-10-CM

## 2023-12-19 DIAGNOSIS — R531 Weakness: Secondary | ICD-10-CM | POA: Diagnosis not present

## 2023-12-19 LAB — URINALYSIS, ROUTINE W REFLEX MICROSCOPIC
Bilirubin Urine: NEGATIVE
Glucose, UA: NEGATIVE mg/dL
Hgb urine dipstick: NEGATIVE
Ketones, ur: NEGATIVE mg/dL
Nitrite: NEGATIVE
Protein, ur: NEGATIVE mg/dL
Specific Gravity, Urine: 1.01 (ref 1.005–1.030)
pH: 6 (ref 5.0–8.0)

## 2023-12-19 LAB — LACTIC ACID, PLASMA
Lactic Acid, Venous: 2.2 mmol/L (ref 0.5–1.9)
Lactic Acid, Venous: 0.9 mmol/L (ref 0.5–1.9)

## 2023-12-19 LAB — BASIC METABOLIC PANEL WITH GFR
Anion gap: 13 (ref 5–15)
Anion gap: 8 (ref 5–15)
BUN: 21 mg/dL (ref 8–23)
BUN: 19 mg/dL (ref 8–23)
CO2: 25 mmol/L (ref 22–32)
CO2: 22 mmol/L (ref 22–32)
Calcium: 9 mg/dL (ref 8.9–10.3)
Calcium: 6.9 mg/dL — ABNORMAL LOW (ref 8.9–10.3)
Chloride: 91 mmol/L — ABNORMAL LOW (ref 98–111)
Chloride: 104 mmol/L (ref 98–111)
Creatinine, Ser: 3.3 mg/dL — ABNORMAL HIGH (ref 0.44–1.00)
Creatinine, Ser: 2.13 mg/dL — ABNORMAL HIGH (ref 0.44–1.00)
GFR, Estimated: 15 mL/min — ABNORMAL LOW (ref >60)
GFR, Estimated: 25 mL/min — ABNORMAL LOW (ref >60)
Glucose, Bld: 153 mg/dL — ABNORMAL HIGH (ref 70–99)
Glucose, Bld: 134 mg/dL — ABNORMAL HIGH (ref 70–99)
Potassium: 4.7 mmol/L (ref 3.5–5.1)
Potassium: 4.1 mmol/L (ref 3.5–5.1)
Sodium: 129 mmol/L — ABNORMAL LOW (ref 135–145)
Sodium: 134 mmol/L — ABNORMAL LOW (ref 135–145)

## 2023-12-19 LAB — ETHANOL: Alcohol, Ethyl (B): <10 mg/dL (ref <10)

## 2023-12-19 LAB — PROTIME-INR
INR: 1.2 (ref 0.8–1.2)
Prothrombin Time: 15 s (ref 11.4–15.2)

## 2023-12-19 LAB — HEPATIC FUNCTION PANEL
ALT: 973 U/L — ABNORMAL HIGH (ref 0–44)
AST: 708 U/L — ABNORMAL HIGH (ref 15–41)
Albumin: 3.9 g/dL (ref 3.5–5.0)
Alkaline Phosphatase: 212 U/L — ABNORMAL HIGH (ref 38–126)
Bilirubin, Direct: 0.3 mg/dL — ABNORMAL HIGH (ref 0.0–0.2)
Indirect Bilirubin: 0.6 mg/dL (ref 0.3–0.9)
Total Bilirubin: 0.9 mg/dL (ref 0.0–1.2)
Total Protein: 7.3 g/dL (ref 6.5–8.1)

## 2023-12-19 LAB — CBC
HCT: 35.3 % — ABNORMAL LOW (ref 36.0–46.0)
Hemoglobin: 12 g/dL (ref 12.0–15.0)
MCH: 32.6 pg (ref 26.0–34.0)
MCHC: 34 g/dL (ref 30.0–36.0)
MCV: 95.9 fL (ref 80.0–100.0)
Platelets: 312 10*3/uL (ref 150–400)
RBC: 3.68 MIL/uL — ABNORMAL LOW (ref 3.87–5.11)
RDW: 14.6 % (ref 11.5–15.5)
WBC: 5.3 10*3/uL (ref 4.0–10.5)
nRBC: 0 % (ref 0.0–0.2)

## 2023-12-19 LAB — TROPONIN I (HIGH SENSITIVITY)
Troponin I (High Sensitivity): 21 ng/L — ABNORMAL HIGH (ref <18)
Troponin I (High Sensitivity): 19 ng/L — ABNORMAL HIGH (ref <18)

## 2023-12-19 LAB — URINALYSIS, MICROSCOPIC (REFLEX): RBC / HPF: NONE SEEN RBC/hpf (ref 0–5)

## 2023-12-19 LAB — CK: Total CK: 108 U/L (ref 38–234)

## 2023-12-19 LAB — HEPATITIS PANEL, ACUTE
HCV Ab: NONREACTIVE
Hep A IgM: NONREACTIVE
Hep B C IgM: NONREACTIVE
Hepatitis B Surface Ag: NONREACTIVE

## 2023-12-19 LAB — SALICYLATE LEVEL: Salicylate Lvl: <7 mg/dL — ABNORMAL LOW (ref 7.0–30.0)

## 2023-12-19 LAB — AMMONIA: Ammonia: <10 umol/L (ref 9–35)

## 2023-12-19 LAB — CBG MONITORING, ED: Glucose-Capillary: 152 mg/dL — ABNORMAL HIGH (ref 70–99)

## 2023-12-19 LAB — ACETAMINOPHEN LEVEL: Acetaminophen (Tylenol), Serum: <10 ug/mL — ABNORMAL LOW (ref 10–30)

## 2023-12-19 LAB — OCCULT BLOOD X 1 CARD TO LAB, STOOL: Fecal Occult Bld: NEGATIVE

## 2023-12-19 MED ORDER — SODIUM CHLORIDE 0.9 % IV SOLN: Freq: Once | INTRAVENOUS | Status: AC

## 2023-12-19 MED ORDER — SODIUM CHLORIDE 0.9 % IV BOLUS
2000.0000 mL | Freq: Once | INTRAVENOUS | Status: AC
Start: 2023-12-19 — End: 2023-12-19
  Administered 2023-12-19: 2000 mL via INTRAVENOUS

## 2023-12-19 MED ORDER — IOHEXOL 350 MG/ML SOLN
80.0000 mL | Freq: Once | INTRAVENOUS | Status: AC | PRN
  Administered 2023-12-19: 80 mL via INTRAVENOUS

## 2023-12-19 MED ORDER — SODIUM CHLORIDE 0.9 % IV BOLUS
1000.0000 mL | Freq: Once | INTRAVENOUS | Status: AC
  Administered 2023-12-19: 1000 mL via INTRAVENOUS

## 2023-12-19 NOTE — Discharge Instructions (Signed)
 Please if you change your mind please go to [facility] or [facility] for admission.

## 2023-12-19 NOTE — ED Provider Notes (Signed)
 [facility] Provider Note   CSN: [id number] Arrival date & time: 12/19/23  1701     History  Chief Complaint  Patient presents with   Weakness    Sheryl Stone is a 68 y.o. female.  Patient with history of hypertension anxiety depression fibromyalgia panic attacks presents to the ED with generalized weakness for the last several weeks.  Family member states that she has basically been bedbound for the last several weeks as she has been weak having falls another fall today.  She is refused to come to the hospital.  Send some pain with urination diarrhea.  She has been too weak to walk on her own.  Multiple falls the last several weeks.  Patient denies chest pain shortness of breath.  Denies any black or bloody stools.  Has not noticed any fevers.  History is somewhat limited as patient is a poor historian.  Sounds like a lot of diarrhea here the last few days.  Has not seek medical attention for this now for a couple weeks despite having clear decompensation at home per family.  Has had a hard time trying to get her to come in for help.  Has some pain to the right wrist right knee from fall today.  The history is provided by the patient and a caregiver.       Home Medications Prior to Admission medications   Medication Sig Start Date End Date Taking? Authorizing Provider  ALPRAZolam Prudy Feeler) 0.25 MG tablet Take 0.25 mg by mouth at bedtime as needed for anxiety.    [provider]  aspirin 81 MG tablet Take 81 mg by mouth daily.    [provider]  Cholecalciferol ([employer] VIT D 5000 HIGH-POTENCY PO) Take by mouth.    [provider]  ezetimibe (ZETIA) 10 MG tablet Take 10 mg by mouth daily.    [provider]  lisinopril (ZESTRIL) 5 MG tablet Take 5 mg by mouth daily.    [provider]  metoprolol succinate (TOPROL-XL) 25 MG 24 hr tablet Take 25 mg by mouth daily.    [provider]   Multiple Vitamin (MULTIVITAMIN) tablet Take 1 tablet by mouth daily.    [provider]  pantoprazole (PROTONIX) 40 MG tablet Take 40 mg by mouth daily.    [provider]  senna-docusate (SENOKOT-S) 8.6-50 MG tablet Take 1 tablet by mouth at bedtime. 09/23/17   Mackuen, Courteney Lyn, MD      Allergies    Latex, Codeine, Elemental sulfur, Penicillins, and Tramadol    Review of Systems   Review of Systems  Physical Exam Updated Vital Signs BP (!) 156/78   Pulse 91   Temp (!) 97.4 F (36.3 C) (Oral)   Resp (!) 22   Ht 5\' 5"  (1.651 m)   Wt 95.3 kg   SpO2 95%   BMI 34.95 kg/m  Physical Exam Vitals and nursing note reviewed.  Constitutional:      General: She is not in acute distress.    Appearance: She is well-developed. She is ill-appearing.  HENT:     Head: Normocephalic and atraumatic.     Mouth/Throat:     Mouth: Mucous membranes are dry.  Eyes:     Extraocular Movements: Extraocular movements intact.     Conjunctiva/sclera: Conjunctivae normal.     Pupils: Pupils are equal, round, and reactive to light.     Comments: Pale conjunctiva  Cardiovascular:  Rate and Rhythm: Normal rate and regular rhythm.     Pulses: Normal pulses.     Heart sounds: Normal heart sounds. No murmur heard. Pulmonary:     Effort: Pulmonary effort is normal. No respiratory distress.     Breath sounds: Normal breath sounds.  Abdominal:     Palpations: Abdomen is soft.     Tenderness: There is no abdominal tenderness.  Musculoskeletal:        General: Tenderness present. No swelling. Normal range of motion.     Cervical back: Normal range of motion and neck supple.     Comments: Tenderness to the right wrist, right knee  Skin:    General: Skin is warm and dry.     Capillary Refill: Capillary refill takes less than 2 seconds.     Comments: Abrasion to the right knee right wrist  Neurological:     General: No focal deficit present.     Mental Status: She is alert.      Comments: She grossly has normal strength and sensation throughout normal visual fields no drift normal speech but she is generally weak pretty hard to get her to fully cooperate with exam given her general weakness  Psychiatric:        Mood and Affect: Mood normal.     ED Results / Procedures / Treatments   Labs (all labs ordered are listed, but only abnormal results are displayed) Labs Reviewed  BASIC METABOLIC PANEL WITH GFR - Abnormal; Notable for the following components:      Result Value   Sodium 129 (*)    Chloride 91 (*)    Glucose, Bld 153 (*)    Creatinine, Ser 3.30 (*)    GFR, Estimated 15 (*)    All other components within normal limits  CBC - Abnormal; Notable for the following components:   RBC 3.68 (*)    HCT 35.3 (*)    All other components within normal limits  URINALYSIS, ROUTINE W REFLEX MICROSCOPIC - Abnormal; Notable for the following components:   Leukocytes,Ua TRACE (*)    All other components within normal limits  HEPATIC FUNCTION PANEL - Abnormal; Notable for the following components:   AST 708 (*)    ALT 973 (*)    Alkaline Phosphatase 212 (*)    Bilirubin, Direct 0.3 (*)    All other components within normal limits  LACTIC ACID, PLASMA - Abnormal; Notable for the following components:   Lactic Acid, Venous 2.2 (*)    All other components within normal limits  ACETAMINOPHEN LEVEL - Abnormal; Notable for the following components:   Acetaminophen (Tylenol), Serum <10 (*)    All other components within normal limits  SALICYLATE LEVEL - Abnormal; Notable for the following components:   Salicylate Lvl <7.0 (*)    All other components within normal limits  BASIC METABOLIC PANEL WITH GFR - Abnormal; Notable for the following components:   Sodium 134 (*)    Glucose, Bld 134 (*)    Creatinine, Ser 2.13 (*)    Calcium 6.9 (*)    GFR, Estimated 25 (*)    All other components within normal limits  URINALYSIS, MICROSCOPIC (REFLEX) - Abnormal; Notable for  the following components:   Bacteria, UA FEW (*)    All other components within normal limits  CBG MONITORING, ED - Abnormal; Notable for the following components:   Glucose-Capillary 152 (*)    All other components within normal limits  TROPONIN I (HIGH SENSITIVITY) -  Abnormal; Notable for the following components:   Troponin I (High Sensitivity) 21 (*)    All other components within normal limits  TROPONIN I (HIGH SENSITIVITY) - Abnormal; Notable for the following components:   Troponin I (High Sensitivity) 19 (*)    All other components within normal limits  CULTURE, BLOOD (ROUTINE X 2)  CULTURE, BLOOD (ROUTINE X 2)  GASTROINTESTINAL PANEL BY PCR, STOOL (REPLACES STOOL CULTURE)  C DIFFICILE QUICK SCREEN W PCR REFLEX    URINE CULTURE  LACTIC ACID, PLASMA  OCCULT BLOOD X 1 CARD TO LAB, STOOL  CK  PROTIME-INR  AMMONIA  ETHANOL  HEPATITIS PANEL, ACUTE    EKG EKG Interpretation Date/Time:  Thursday December 19 2023 17:12:55 EDT Ventricular Rate:  73 PR Interval:  192 QRS Duration:  76 QT Interval:  448 QTC Calculation: 493 R Axis:   15  Text Interpretation: Normal sinus rhythm When compared with ECG of 22-Oct-2022 13:54, PREVIOUS ECG IS PRESENT Confirmed by Virgina Norfolk [phone]) on 12/19/2023 5:24:52 PM  Radiology CT Angio Chest/Abd/Pel for Dissection W and/or Wo Contrast Result Date: 12/19/2023 CLINICAL DATA:  Acute aortic syndrome suspected. Weakness, multiple falls, and abdominal pain. EXAM: CT ANGIOGRAPHY CHEST, ABDOMEN AND PELVIS TECHNIQUE: Non-contrast CT of the chest was initially obtained. Multidetector CT imaging through the chest, abdomen and pelvis was performed using the standard protocol during bolus administration of intravenous contrast. Multiplanar reconstructed images and MIPs were obtained and reviewed to evaluate the vascular anatomy. RADIATION DOSE REDUCTION: This exam was performed according to the departmental dose-optimization program which includes automated  exposure control, adjustment of the mA and/or kV according to patient size and/or use of iterative reconstruction technique. CONTRAST:  80mL OMNIPAQUE IOHEXOL 350 MG/ML SOLN COMPARISON:  12/19/2023, 04/29/2022. FINDINGS: CTA CHEST FINDINGS Cardiovascular: The heart is enlarged and there is a trace pericardial effusion. Three-vessel coronary artery calcifications are noted. There is atherosclerotic calcification of the aorta without evidence of aneurysm. No dissection is seen. The pulmonary trunk is normal in caliber. Mediastinum/Nodes: No enlarged mediastinal, hilar, or axillary lymph nodes. Thyroid gland, trachea, and esophagus demonstrate no significant findings. There is a small hiatal hernia. Lungs/Pleura: Interlobular septal thickening is present bilaterally. Scattered atelectasis is noted bilaterally. There is a trace right pleural effusion. No pneumothorax is seen. There is a 5 mm nodule in the left lower lobe, axial image 81. There is a 4 mm nodule in the right lower lobe, axial image 58. There is a 3 mm nodule in the right lower lobe, axial image 69 there is a 3 mm nodule in the right lower lobe, axial image 79. There is a 9 mm nodule in the right lower lobe, axial image 96. There is a 3 mm nodule in the left lower lobe, axial image 94. Additional 2 mm nodules are noted bilaterally. Musculoskeletal: No acute osseous abnormality. Review of the MIP images confirms the above findings. CTA ABDOMEN AND PELVIS FINDINGS VASCULAR Aorta: Normal caliber aorta without aneurysm, dissection, vasculitis or significant stenosis. Aortic atherosclerosis. Celiac: Patent without evidence of aneurysm, dissection, vasculitis or significant stenosis. SMA: Patent without evidence of aneurysm, dissection, vasculitis or significant stenosis. Renals: Both renal arteries are patent without evidence of aneurysm, dissection, vasculitis, fibromuscular dysplasia or significant stenosis. IMA: Patent without evidence of aneurysm,  dissection, vasculitis or significant stenosis. Inflow: Patent without evidence of aneurysm, dissection, vasculitis or significant stenosis. Veins: No obvious venous abnormality within the limitations of this arterial phase study. Review of the MIP images confirms the above findings. NON-VASCULAR  Hepatobiliary: No focal liver abnormality is seen. Mild hepatic steatosis is seen. Status post cholecystectomy. No biliary dilatation for post cholecystectomy status. Pancreas: Unremarkable. No pancreatic ductal dilatation or surrounding inflammatory changes. Spleen: Normal in size without focal abnormality. Adrenals/Urinary Tract: There is a 1.1 left adrenal nodule with attenuation of 55 Hounsfield units. The right adrenal gland is within normal limits. The kidneys enhance symmetrically. No renal calculus or obstructive uropathy bilaterally. Bladder is unremarkable. Stomach/Bowel: There is a small hiatal hernia. Stomach is within normal limits. Appendix appears normal. No evidence of bowel wall thickening, distention, or inflammatory changes. No free air or pneumatosis. Scattered diverticula are noted along the colon without evidence of diverticulitis. Lymphatic: No abdominal or pelvic lymphadenopathy. Reproductive: Status post hysterectomy. Cystic structures are present in the right ovary measuring up to 2.6 cm. Other: No abdominopelvic ascites. Musculoskeletal: Degenerative changes are present in the lumbar spine. No acute osseous abnormality. Review of the MIP images confirms the above findings. IMPRESSION: 1. Aortic atherosclerosis without evidence of aneurysm or dissection. 2. Interlobular septal thickening in the lungs bilaterally, possible edema or pneumonitis in the appropriate clinical setting. 3. Trace right pleural effusion. 4. Stable scattered pulmonary nodules bilaterally measuring up to 9 mm in the right lower lobe, not significantly changed from 2023. 5. Stable left adrenal nodule, previously characterized  as adenoma. 6. Diverticulosis without diverticulitis. 7. Coronary artery calcifications. Electronically Signed   By: Thornell Sartorius M.D.   On: 12/19/2023 21:41   CT Cervical Spine Wo Contrast Result Date: 12/19/2023 CLINICAL DATA:  Trauma EXAM: CT CERVICAL SPINE WITHOUT CONTRAST TECHNIQUE: Multidetector CT imaging of the cervical spine was performed without intravenous contrast. Multiplanar CT image reconstructions were also generated. RADIATION DOSE REDUCTION: This exam was performed according to the departmental dose-optimization program which includes automated exposure control, adjustment of the mA and/or kV according to patient size and/or use of iterative reconstruction technique. COMPARISON:  None Available. FINDINGS: Alignment: Normal. Skull base and vertebrae: No acute fracture. No primary bone lesion or focal pathologic process. Soft tissues and spinal canal: No prevertebral fluid or swelling. No visible canal hematoma. Disc levels: Diffuse right-sided facet arthropathy present. No significant central canal or neural foraminal stenosis at any level. Upper chest: Negative. Other: None. IMPRESSION: No acute fracture or traumatic subluxation of the cervical spine. Electronically Signed   By: Darliss Cheney M.D.   On: 12/19/2023 20:17   CT Head Wo Contrast Result Date: 12/19/2023 CLINICAL DATA:  Trauma EXAM: CT HEAD WITHOUT CONTRAST TECHNIQUE: Contiguous axial images were obtained from the base of the skull through the vertex without intravenous contrast. RADIATION DOSE REDUCTION: This exam was performed according to the departmental dose-optimization program which includes automated exposure control, adjustment of the mA and/or kV according to patient size and/or use of iterative reconstruction technique. COMPARISON:  None Available. FINDINGS: Brain: No evidence of acute infarction, hemorrhage, hydrocephalus, extra-axial collection or mass lesion/mass effect. There is mild periventricular white matter  hypodensity. There are few scattered punctate cerebral cortical calcifications. Vascular: Atherosclerotic calcifications are present within the cavernous internal carotid arteries. Skull: Normal. Negative for fracture or focal lesion. Sinuses/Orbits: No acute finding. Other: None. IMPRESSION: 1. No acute intracranial process. 2. Mild chronic small vessel ischemic changes. Electronically Signed   By: Darliss Cheney M.D.   On: 12/19/2023 20:16   CT ABDOMEN PELVIS WO CONTRAST Result Date: 12/19/2023 CLINICAL DATA:  Acute abdominal pain EXAM: CT ABDOMEN AND PELVIS WITHOUT CONTRAST TECHNIQUE: Multidetector CT imaging of the abdomen and pelvis was performed  following the standard protocol without IV contrast. RADIATION DOSE REDUCTION: This exam was performed according to the departmental dose-optimization program which includes automated exposure control, adjustment of the mA and/or kV according to patient size and/or use of iterative reconstruction technique. COMPARISON:  CT abdomen and pelvis 10/22/2022. CT abdomen and pelvis 09/23/2017 FINDINGS: Lower chest: There is a 9 mm right lower lobe pulmonary nodule (measured 7 mm and 2018). Hepatobiliary: No focal liver abnormality is seen. Status post cholecystectomy. No biliary dilatation. Pancreas: Unremarkable. No pancreatic ductal dilatation or surrounding inflammatory changes. Spleen: Normal in size without focal abnormality. Adrenals/Urinary Tract: There some vague subcentimeter hyperdense areas within the left renal cortex, possibly hemorrhagic cysts. No renal calculi. No hydronephrosis or perinephric fat stranding. The adrenal glands are stable. There is a 13 mm indeterminate left adrenal nodule. The bladder is within normal limits. Stomach/Bowel: Stomach is within normal limits. Appendix appears normal. No evidence of bowel wall thickening, distention, or inflammatory changes. There is sigmoid colon diverticulosis. Vascular/Lymphatic: There are atherosclerotic  calcifications of the aorta. Aorta is normal in size. No enlarged lymph nodes are seen. Reproductive: There is a 2.3 cm rounded hypodensity in the right ovary compatible with cyst similar to prior examination. Left ovary is within normal limits. The uterus is surgically absent. Other: No abdominal wall hernia or abnormality. No abdominopelvic ascites. Musculoskeletal: No acute or significant osseous findings. IMPRESSION: 1. No acute localizing process in the abdomen or pelvis. 2. Sigmoid colon diverticulosis. 3. Stable right ovarian cyst compared to 2018. 4. Stable indeterminate left adrenal nodule. 5. Vague subcentimeter hyperdense areas in the left renal cortex, possibly hemorrhagic cysts. Consider further evaluation with renal ultrasound. 6. 9 mm right lower lobe pulmonary nodule has only minimally increased in size compared to 2018, indeterminate. 7. Aortic atherosclerosis. Aortic Atherosclerosis (ICD10-I70.0). Electronically Signed   By: Darliss Cheney M.D.   On: 12/19/2023 20:12   DG Pelvis Portable Result Date: 12/19/2023 CLINICAL DATA:  Pain after fall EXAM: PORTABLE PELVIS 1-2 VIEWS COMPARISON:  CT 10/22/2022 FINDINGS: SI joints are non widened. Pubic symphysis and rami appear intact. No fracture or malalignment. IMPRESSION: Negative. Electronically Signed   By: Jasmine Pang M.D.   On: 12/19/2023 18:54   DG Wrist Complete Right Result Date: 12/19/2023 CLINICAL DATA:  Pain after fall EXAM: RIGHT WRIST - COMPLETE 3+ VIEW COMPARISON:  None Available. FINDINGS: No malalignment. Suggestion of scattered cyst in the scaphoid and lunate. Questionable fracture deformity at the mid scaphoid on scaphoid view. Positive for soft tissue swelling IMPRESSION: Questionable fracture deformity at the mid scaphoid. Correlate for point tenderness with CT follow-up if deemed clinically appropriate. Electronically Signed   By: Jasmine Pang M.D.   On: 12/19/2023 18:53   DG Knee Complete 4 Views Right Result Date:  12/19/2023 CLINICAL DATA:  Pain after fall EXAM: RIGHT KNEE - COMPLETE 4+ VIEW COMPARISON:  None Available. FINDINGS: No evidence of fracture, dislocation, or joint effusion. No evidence of arthropathy or other focal bone abnormality. Soft tissues are unremarkable. IMPRESSION: Negative. Electronically Signed   By: Jasmine Pang M.D.   On: 12/19/2023 18:50   DG Chest Portable 1 View Result Date: 12/19/2023 CLINICAL DATA:  Fall EXAM: PORTABLE CHEST 1 VIEW COMPARISON:  10/26/2023 FINDINGS: Linear scarring or atelectasis in the left lower lung. Mild chronic appearing bronchitic change. No acute airspace disease, pleural effusion or pneumothorax. IMPRESSION: No active disease. Electronically Signed   By: Jasmine Pang M.D.   On: 12/19/2023 18:49    Procedures Procedures  Medications Ordered in ED Medications  sodium chloride 0.9 % bolus 2,000 mL (0 mLs Intravenous Stopped 12/19/23 1930)  sodium chloride 0.9 % bolus 1,000 mL (0 mLs Intravenous Stopped 12/19/23 1948)  sodium chloride 0.9 % bolus 1,000 mL (0 mLs Intravenous Stopped 12/19/23 2152)  0.9 %  sodium chloride infusion ( Intravenous New Bag/Given 12/19/23 2154)  iohexol (OMNIPAQUE) 350 MG/ML injection 80 mL (80 mLs Intravenous Contrast Given 12/19/23 2057)    ED Course/ Medical Decision Making/ A&P                                 Medical Decision Making Amount and/or Complexity of Data Reviewed Labs: ordered. Radiology: ordered.  Risk Prescription drug management.   Marleny Faller is here with generalized weakness.  History of fibromyalgia anxiety depression.  Broad differential.  She arrives with low-grade fever 99.3 rectally.  Blood pressure somewhat labile.  She had 97/52 upon arrival but has had some hypotensive episodes.  Ultimately will give her 2 L IV fluids check labs get blood cultures lactic acid CT head and neck.  EKG shows sinus rhythm.  No ischemic changes.  Stool looks grossly brown.  She has had a lot of diarrhea  some pain with urination.  I am not sure if this is severe dehydration polypharmacy infectious process stroke.  Is not having any chest pain or shortness of breath.  Seems less likely to be ACS or PE.  Will pursue broad workup reevaluate.  She started to have low blood pressures but on my reevaluation she was consistently having low blood pressures in her right upper arm to 70 systolic.  This was repeated several times she consistently was hypotensive in the right arm but on the left arm she be normal tensive in the 120s 130s.  Ultimately she is complaining of chest pain going into her back upper abdominal pain.  It is a very complicated story as she has not been doing well for the last few weeks having falls not eating not drinking unable to ambulate or take care of herself.  I am now concerned for possibly a dissection given this blood pressure difference.  I can Doppler pulses in her extremities.  I have rechecked the blood pressure cuff multiple times.  However she does have a significant AKI with a creatinine of 3.3.  Liver enzymes are also greatly enlarged with AST is 708, ALT of 973, alk phos of 212.  Troponin 21 and 19.  I did noncontrasted study of her abdomen and pelvis head chest x-ray pelvic x-ray which were unremarkable per radiology report.  Maybe a pulmonary nodule in the right lower lung.  There may be left renal cyst.  I talked on the phone with both Dr. Vickii Chafe who also talked with Dr. Jake Samples radiology.  Given that she does have an AKI but I do think that she needs a dissection study given risks and benefits.  Does not sound like there is a great option for dissection study otherwise MRA might not be very useful there is contrast with that and may miss some details as well.  Also time limiting as she needs to be transferred for this.  I have extensively hydrated her with IV fluids with 4 L and ultimately had a long discussion with both the patient and family member about risks and benefits of  doing the CT scan with contrast and may be causing more renal injury.  Ultimately I think it is necessary to do the CT with contrast to rule out life-threatening processes.  They were agreeable with this.  Radiology signed off on this as well.  Will do reduced dose and continue to hydrate.  Ultimately I think she needs admission.  I am not sure if this is all from severe dehydration.  I am not sure what to make of her liver enzyme elevation.  Liver and gallbladder on CT were unremarkable.  Hemoglobin is normal.  Electrolytes unremarkable otherwise.  Lactic acid was 2.2.  She has no fever no leukocytosis.  I do not really have a source for infection.  Robotics have been held at this time.  Blood cultures have been sent.  Ammonia levels normal.  Tylenol level salicylate level normal.  CK is normal and no evidence to suggest rhabdomyolysis.  X-ray of the right hand does show questionable fracture deformity at the mid scaphoid.  She is tender in this area.  Will talk with Dr. Merrilyn Puma for hand.  After 3 L of IV fluids patient's creatinine is actually come down nicely to 2.13.  GFR is increased to 25.  She is getting additional fluid bolus prior to CT scan as well as started on maintenance fluids.  Urinalysis does not appear consistent with infection.  Overall awaiting CT dissection study then we will admit for further hydration and care.   Dissection study is unremarkable.  No evidence of aneurysm or dissection.  Possible pneumonitis or edema on CT.  Pulmonary nodule.  Not significantly changed from 2 years ago.  Ultimately she appears to have shock liver AKI no obvious source for infection.  Failure to thrive.  I strongly recommended admission for hydration and monitoring of her creatinine and monitoring of her liver enzymes PT OT probably an MRI to evaluate for stroke including other cardiac workup.  I talked with both her and her husband about this but patient wanted to leave AGAINST MEDICAL ADVICE.  She states  that she did not want to seek any further treatment she wanted God in nature take its course.  She has capacity to make this decision.  Her husband talked with her as well and ultimately he wants to do what she wants to do.  She understands that this is life and death and that she could die or have permanent disability if she gets discharged or leaves.  Ultimately she decided to leave AGAINST MEDICAL ADVICE.  Gave her information to follow-up with Dr. Merlyn Lot for her possible hand fracture.  I strongly urged her to return to the ED if she changes her mind.  This chart was dictated using voice recognition software.  Despite best efforts to proofread,  errors can occur which can change the documentation meaning.         Final Clinical Impression(s) / ED Diagnoses Final diagnoses:  AKI (acute kidney injury) (HCC)  Elevated liver enzymes  Closed fracture of right hand, initial encounter  Weakness    Rx / DC Orders ED Discharge Orders     None         Virgina Norfolk, DO 12/19/23 2233

## 2023-12-19 NOTE — ED Notes (Addendum)
Patient transported to CT with nurse at bedside 

## 2023-12-19 NOTE — ED Notes (Signed)
 ED Provider at bedside.

## 2023-12-19 NOTE — Plan of Care (Addendum)
 Plan of Care Note for accepted transfer  Patient: Sheryl Stone              ZOX:[id number]  DOA: 12/19/2023     Facility requesting transfer: [facility] emergency department Requesting Provider:Dr. Lockie Mola   Reason for transfer: Recurrent fall, diarrhea, AKI, demand ischemia in the context of hypotension.  ED triage note:    Pt states that she fell today. Family states that pt has been having episodes x 3 weeks with walking and will fall. Pt fell today in home. Unclear if pt LOC. Pt family states that pt has had an increase in falls. Family states that for the last 3 weeks pt will be talking and will drift off to sleep. Pt has bandages on right arm from fall and right knee. Pt A&O x 3      Facility course: 68 year old female history of generalized anxiety disorder, hyperlipidemia, essential hypertension and fibromyalgia presented to emergency department with complaining of generalized weakness for last several weeks.amily member states that she has basically been bedbound for the last several weeks as she has been weak having falls another fall today.  She is refused to come to the hospital.  Send some pain with urination diarrhea.  She has been too weak to walk on her own.  Multiple falls the last several weeks.  Patient denies chest pain shortness of breath.  Denies any black or bloody stools.  Has not noticed any fevers.  History is somewhat limited as patient is a poor historian.  Sounds like a lot of diarrhea here the last few days.  Has not seek medical attention for this now for a couple weeks despite having clear decompensation at home per family.  Has had a hard time trying to get her to come in for help.  Has some pain to the right wrist right knee from fall today.    At presentation to ED patient found to be hypotensive blood pressure of 71/44 which has improved to 156/78 after receiving 1 L of NS. BMP showing elevated creatinine 3.30 which has been improving gradually. CBC  unremarkable. UA showing evidence of UTI.  Pending urine culture. Elevated troponin 21 and 19. Elevated lactic acid level 2.2 which has been improved to 0.9. Normal ammonia level. Normal CK. Pending GI and C. difficile panel. FOBT negative for any occult blood. Blood culture and urine cultures are in process. EKG normal sinus rhythm heart rate 73.  Extensive imagings are following.  Chest x-ray showing linear atelectasis no active disease process. X-ray of the right knee negative. X-ray of the right wrist questionable fracture at the medial scaphoid. X-ray pelvis negative. CT abdomen pelvis no acute localized disease process of the abdomen and pelvis. CT head no acute intracranial abnormality. CT cervical spine no acute fracture. CT angio chest abdomen pelvis possible edema pneumonitis.  Aortic atherosclerosis.  Trace right pleural effusion.  Stable scattered pulmonary nodules since 2023.  Stable left hilar nodule.  Diverticulosis without diverticulitis.  Coronary artery calcification.  In the ED patient has been resuscitated with 1 L of LR bolus.  However I have called [facility] [phone] informed by the transfer center that patient is leaving AMA. ED physician will reaches out if patient changes her mind and wants to get admitted for treatment.   Author: Tereasa Coop, MD  12/19/2023  [facility]

## 2023-12-19 NOTE — ED Triage Notes (Addendum)
 Pt states that she fell today. Family states that pt has been having episodes x 3 weeks with walking and will fall. Pt fell today in home. Unclear if pt LOC. Pt family states that pt has had an increase in falls. Family states that for the last 3 weeks pt will be talking and will drift off to sleep. Pt has bandages on right arm from fall and right knee. Pt A&O x 3

## 2023-12-20 LAB — URINE CULTURE: Culture: NO GROWTH

## 2023-12-24 LAB — CULTURE, BLOOD (ROUTINE X 2)
Culture: NO GROWTH
Culture: NO GROWTH
Special Requests: ADEQUATE

## 2024-03-01 ENCOUNTER — Emergency Department (HOSPITAL_COMMUNITY)

## 2024-03-01 ENCOUNTER — Other Ambulatory Visit: Payer: Self-pay

## 2024-03-01 ENCOUNTER — Encounter (HOSPITAL_COMMUNITY): Payer: Self-pay

## 2024-03-01 ENCOUNTER — Emergency Department (HOSPITAL_COMMUNITY)
Admission: EM | Admit: 2024-03-01 | Discharge: 2024-03-01 | Disposition: A | Discharge status: Home / Self Care | Attending: Emergency Medicine | Admitting: Emergency Medicine

## 2024-03-01 DIAGNOSIS — Z91040 Latex allergy status: Secondary | ICD-10-CM | POA: Diagnosis not present

## 2024-03-01 DIAGNOSIS — Z7982 Long term (current) use of aspirin: Secondary | ICD-10-CM | POA: Insufficient documentation

## 2024-03-01 DIAGNOSIS — R4182 Altered mental status, unspecified: Secondary | ICD-10-CM | POA: Insufficient documentation

## 2024-03-01 DIAGNOSIS — E871 Hypo-osmolality and hyponatremia: Secondary | ICD-10-CM | POA: Diagnosis not present

## 2024-03-01 DIAGNOSIS — Y900 Blood alcohol level of less than 20 mg/100 ml: Secondary | ICD-10-CM | POA: Insufficient documentation

## 2024-03-01 LAB — I-STAT CHEM 8, ED
BUN: 31 mg/dL — ABNORMAL HIGH (ref 8–23)
Calcium, Ion: 1.1 mmol/L — ABNORMAL LOW (ref 1.15–1.40)
Chloride: 88 mmol/L — ABNORMAL LOW (ref 98–111)
Creatinine, Ser: 1 mg/dL (ref 0.44–1.00)
Glucose, Bld: 187 mg/dL — ABNORMAL HIGH (ref 70–99)
HCT: 41 % (ref 36.0–46.0)
Hemoglobin: 13.9 g/dL (ref 12.0–15.0)
Potassium: 3.9 mmol/L (ref 3.5–5.1)
Sodium: 126 mmol/L — ABNORMAL LOW (ref 135–145)
TCO2: 24 mmol/L (ref 22–32)

## 2024-03-01 LAB — CBC
HCT: 38.4 % (ref 36.0–46.0)
Hemoglobin: 13.3 g/dL (ref 12.0–15.0)
MCH: 32.8 pg (ref 26.0–34.0)
MCHC: 34.6 g/dL (ref 30.0–36.0)
MCV: 94.6 fL (ref 80.0–100.0)
Platelets: 426 10*3/uL — ABNORMAL HIGH (ref 150–400)
RBC: 4.06 MIL/uL (ref 3.87–5.11)
RDW: 13.3 % (ref 11.5–15.5)
WBC: 13 10*3/uL — ABNORMAL HIGH (ref 4.0–10.5)
nRBC: 0 % (ref 0.0–0.2)

## 2024-03-01 LAB — DIFFERENTIAL
Abs Immature Granulocytes: 0.04 10*3/uL (ref 0.00–0.07)
Basophils Absolute: 0 10*3/uL (ref 0.0–0.1)
Basophils Relative: 0 %
Eosinophils Absolute: 0 10*3/uL (ref 0.0–0.5)
Eosinophils Relative: 0 %
Immature Granulocytes: 0 %
Lymphocytes Relative: 10 %
Lymphs Abs: 1.3 10*3/uL (ref 0.7–4.0)
Monocytes Absolute: 0.8 10*3/uL (ref 0.1–1.0)
Monocytes Relative: 6 %
Neutro Abs: 10.8 10*3/uL — ABNORMAL HIGH (ref 1.7–7.7)
Neutrophils Relative %: 84 %

## 2024-03-01 LAB — ETHANOL: Alcohol, Ethyl (B): <15 mg/dL (ref <15)

## 2024-03-01 LAB — COMPREHENSIVE METABOLIC PANEL WITH GFR
ALT: 33 U/L (ref 0–44)
AST: 37 U/L (ref 15–41)
Albumin: 4.2 g/dL (ref 3.5–5.0)
Alkaline Phosphatase: 77 U/L (ref 38–126)
Anion gap: 15 (ref 5–15)
BUN: 29 mg/dL — ABNORMAL HIGH (ref 8–23)
CO2: 23 mmol/L (ref 22–32)
Calcium: 9.4 mg/dL (ref 8.9–10.3)
Chloride: 87 mmol/L — ABNORMAL LOW (ref 98–111)
Creatinine, Ser: 1.15 mg/dL — ABNORMAL HIGH (ref 0.44–1.00)
GFR, Estimated: 51 mL/min — ABNORMAL LOW (ref >60)
Glucose, Bld: 189 mg/dL — ABNORMAL HIGH (ref 70–99)
Potassium: 3.8 mmol/L (ref 3.5–5.1)
Sodium: 125 mmol/L — ABNORMAL LOW (ref 135–145)
Total Bilirubin: 0.8 mg/dL (ref 0.0–1.2)
Total Protein: 7.6 g/dL (ref 6.5–8.1)

## 2024-03-01 LAB — AMMONIA: Ammonia: <13 umol/L (ref 9–35)

## 2024-03-01 MED ORDER — SODIUM CHLORIDE 0.9 % IV BOLUS
500.0000 mL | Freq: Once | INTRAVENOUS | Status: AC
  Administered 2024-03-01: 500 mL via INTRAVENOUS

## 2024-03-01 NOTE — ED Notes (Signed)
 Called CCMD to put pt on monitor

## 2024-03-01 NOTE — ED Provider Notes (Signed)
 [place] [facility] AT [facility] Provider Note   CSN: [id number] Arrival date & time: 03/01/24  7829     History  Chief Complaint  Patient presents with   Altered Mental Status    Sheryl Stone is a 68 y.o. female.   Altered Mental Status  Patient presents to the ED for evaluation of confusion, altered mental status.  According to the EMS reports patient is usually alert and oriented and writes things down.  However since Wednesday she has not been doing that.  She has been lying in bed more.  She has been reportedly confused.  Patient herself denies any complaints.  She states that her husband and her sister were concerned about confusion.  She does not feel that she is confused.  She is not having any trouble with any headaches.  She denies any fevers or chills.  She has had a slight cough recently.  She is not having any numbness or weakness.  No chest pain or abdominal pain.  Husband reports that the patient has been confused about simple things.  She was not taking her medications the last couple days.  She had not been writing that down like she normally does.  Today she asked him why he was not going to work and he had to tell her that it was Sunday.  Home Medications Prior to Admission medications   Medication Sig Start Date End Date Taking? Authorizing Provider  ALPRAZolam (XANAX) 0.25 MG tablet Take 0.25 mg by mouth at bedtime as needed for anxiety.    [provider]  aspirin 81 MG tablet Take 81 mg by mouth daily.    [provider]  Cholecalciferol ([employer] VIT D 5000 HIGH-POTENCY PO) Take by mouth.    [provider]  ezetimibe (ZETIA) 10 MG tablet Take 10 mg by mouth daily.    [provider]  lisinopril (ZESTRIL) 5 MG tablet Take 5 mg by mouth daily.    [provider]  metoprolol succinate (TOPROL-XL) 25 MG 24 hr tablet Take 25 mg by mouth daily.    [provider]  Multiple Vitamin  (MULTIVITAMIN) tablet Take 1 tablet by mouth daily.    [provider]  pantoprazole  (PROTONIX ) 40 MG tablet Take 40 mg by mouth daily.    [provider]  senna-docusate (SENOKOT-S) 8.6-50 MG tablet Take 1 tablet by mouth at bedtime. 09/23/17   Mackuen, Courteney Lyn, MD      Allergies    Latex, Codeine, Elemental sulfur, Penicillins, and Tramadol    Review of Systems   Review of Systems  Physical Exam Updated Vital Signs BP (!) 158/69   Pulse (!) 101   Temp 98.8 F (37.1 C) (Oral)   Resp (!) 28   Ht 1.651 m (5\' 5" )   Wt 95.3 kg   SpO2 98%   BMI 34.96 kg/m  Physical Exam Vitals and nursing note reviewed.  Constitutional:      General: She is not in acute distress.    Appearance: She is well-developed.  HENT:     Head: Normocephalic and atraumatic.     Right Ear: External ear normal.     Left Ear: External ear normal.  Eyes:     General: No visual field deficit or scleral icterus.       Right eye: No discharge.        Left eye: No discharge.     Conjunctiva/sclera: Conjunctivae normal.  Neck:  Trachea: No tracheal deviation.  Cardiovascular:     Rate and Rhythm: Normal rate and regular rhythm.  Pulmonary:     Effort: Pulmonary effort is normal. No respiratory distress.     Breath sounds: Normal breath sounds. No stridor. No wheezing or rales.  Abdominal:     General: Bowel sounds are normal. There is no distension.     Palpations: Abdomen is soft.     Tenderness: There is no abdominal tenderness. There is no guarding or rebound.  Musculoskeletal:        General: No tenderness.     Cervical back: Neck supple.  Skin:    General: Skin is warm and dry.     Findings: No rash.  Neurological:     Mental Status: She is alert and oriented to person, place, and time.     GCS: GCS eye subscore is 4. GCS verbal subscore is 5. GCS motor subscore is 6.     Cranial Nerves: No cranial nerve deficit, dysarthria or facial asymmetry.     Sensory: No sensory  deficit.     Motor: No abnormal muscle tone, seizure activity or pronator drift.     Coordination: Coordination normal.     Comments:  able to hold both legs off bed for 5 seconds, sensation intact in all extremities,  no left or right sided neglect, normal finger-nose exam bilaterally, no nystagmus noted  Patient able to tell me the month year or location as well as her birthdate   Psychiatric:        Mood and Affect: Mood normal.     ED Results / Procedures / Treatments   Labs (all labs ordered are listed, but only abnormal results are displayed) Labs Reviewed  CBC - Abnormal; Notable for the following components:      Result Value   WBC 13.0 (*)    Platelets 426 (*)    All other components within normal limits  DIFFERENTIAL - Abnormal; Notable for the following components:   Neutro Abs 10.8 (*)    All other components within normal limits  COMPREHENSIVE METABOLIC PANEL WITH GFR - Abnormal; Notable for the following components:   Sodium 125 (*)    Chloride 87 (*)    Glucose, Bld 189 (*)    BUN 29 (*)    Creatinine, Ser 1.15 (*)    GFR, Estimated 51 (*)    All other components within normal limits  I-STAT CHEM 8, ED - Abnormal; Notable for the following components:   Sodium 126 (*)    Chloride 88 (*)    BUN 31 (*)    Glucose, Bld 187 (*)    Calcium, Ion 1.10 (*)    All other components within normal limits  AMMONIA  ETHANOL  URINALYSIS, W/ REFLEX TO CULTURE (INFECTION SUSPECTED)  RAPID URINE DRUG SCREEN, HOSP PERFORMED    EKG EKG Interpretation Date/Time:  Sunday March 01 2024 10:27:46 EDT Ventricular Rate:  104 PR Interval:  186 QRS Duration:  95 QT Interval:  354 QTC Calculation: 466 R Axis:   37  Text Interpretation: Sinus tachycardia Abnormal R-wave progression, early transition Abnormal T, consider ischemia, diffuse leads Since last tracing rate faster Confirmed by Trish Furl [phone]) on 03/01/2024 10:37:30 AM  Radiology CT HEAD WO CONTRAST Result Date:  03/01/2024 CLINICAL DATA:  68 year old female with altered mental status. Neurologic deficit. Confusion. EXAM: CT HEAD WITHOUT CONTRAST TECHNIQUE: Contiguous axial images were obtained from the base of the skull through the vertex without  intravenous contrast. RADIATION DOSE REDUCTION: This exam was performed according to the departmental dose-optimization program which includes automated exposure control, adjustment of the mA and/or kV according to patient size and/or use of iterative reconstruction technique. COMPARISON:  Head CT 12/19/2023. FINDINGS: Brain: Stable cerebral volume. No midline shift, ventriculomegaly, mass effect, evidence of mass lesion, intracranial hemorrhage or evidence of cortically based acute infarction. Mild for age patchy bilateral white matter hypodensity appears stable since March. Vascular: Calcified atherosclerosis at the skull base. No suspicious intracranial vascular hyperdensity. But of Skull: No acute osseous abnormality identified. Chronic left TMJ degeneration. Sinuses/Orbits: Visualized paranasal sinuses and mastoids are stable and well aerated. Other: Visualized orbits and scalp soft tissues are within normal limits. IMPRESSION: 1. No acute intracranial abnormality. 2. Stable mild for age cerebral white matter changes. Electronically Signed   By: Marlise Simpers M.D.   On: 03/01/2024 12:01   DG Chest 2 View Result Date: 03/01/2024 CLINICAL DATA:  Confusion. EXAM: CHEST - 2 VIEW COMPARISON:  Chest radiograph dated 01/05/2024. FINDINGS: No focal consolidation, pleural effusion or pneumothorax. The cardiac silhouette is within limits. Atherosclerotic calcification of the aorta. No acute osseous pathology. IMPRESSION: No active cardiopulmonary disease. Electronically Signed   By: Angus Bark M.D.   On: 03/01/2024 10:55    Procedures Procedures    Medications Ordered in ED Medications  sodium chloride  0.9 % bolus 500 mL (0 mLs Intravenous Stopped 03/01/24 1159)    ED Course/  Medical Decision Making/ A&P Clinical Course as of 03/01/24 1253  Sun Mar 01, 2024  1104 CBC(!) CBC shows leukocytosis of 13 [JK]  1105 Comprehensive metabolic panel(!) Metabolic panel shows a sodium of 125, new compared to previous results [JK]  1105 Chest x-ray without acute findings [JK]  1228 Head CT does not show any acute abnormality [JK]    Clinical Course User Index [JK] Trish Furl, MD                                 Medical Decision Making Problems Addressed: Hyponatremia: acute illness or injury that poses a threat to life or bodily functions  Amount and/or Complexity of Data Reviewed Labs: ordered. Decision-making details documented in ED Course. Radiology: ordered.   Patient presented to the ED for evaluation of some confusion noted by patient's husband.  In the emergency room patient is alert and oriented.  She has a normal neurologic exam.  No signs of delirium.  Patient's exam is unremarkable.  Her CT scan does not show any acute abnormality.  No signs of hemorrhage mass or obvious stroke.  Patient's laboratory test do show hyponatremia with a sodium level of 125.  The previous level was 134.  Possible that her hyponatremia is related to her symptoms.  Patient states she does drink at least 6 bottles of plain water at home.  She is not having any trouble with diarrhea.  She has not had any nausea or vomiting.  Recommend admission to the hospital because of her low sodium level and the confusion and that family members have noted.  Patient declines admission.  She will be home with her husband.  I recommended she decrease her free water intake and try to increase her sodium intake.  Instructed her to follow-up with her primary care doctor early this week to have her sodium level rechecked.  She should return to the ED for worsening symptoms.  Final Clinical Impression(s) / ED Diagnoses Final diagnoses:  Hyponatremia    Rx / DC Orders ED Discharge Orders      None         Trish Furl, MD 03/01/24 1256

## 2024-03-01 NOTE — Discharge Instructions (Addendum)
 I recommend admission to the hospital for further treatment.  Please follow-up with your doctor this week to be rechecked since you have decided to go home.  We need to make sure your sodium level is not getting worse.  Try to increase your salt intake.  Avoid drinking excessive plain water.  Return to the emergency room for worsening symptoms or other concerns

## 2024-03-01 NOTE — ED Notes (Signed)
 Patient transported to X-ray

## 2024-03-01 NOTE — ED Triage Notes (Signed)
 Patient BIB EMS from home after her husband called out due to the patient acting abnormal. Husband states that she is normally AxOx4 and writes everything she does down, however, since Wednesday she has not been doing that and has been declining. Husband states she has been laying in bed. Husband reports no recent falls and no blood thinners. Patient was AxOx3 with EMS in the home, but en route she couldn't tell some things she was able to before. EMS reports not much consistency with her AxO as she will be x4 at times and then forget at others. Stroke screening negative.

## 2024-04-20 ENCOUNTER — Ambulatory Visit: Admitting: Physician Assistant

## 2024-04-23 ENCOUNTER — Ambulatory Visit: Admitting: Physician Assistant

## 2024-04-24 ENCOUNTER — Emergency Department (HOSPITAL_BASED_OUTPATIENT_CLINIC_OR_DEPARTMENT_OTHER)

## 2024-04-24 ENCOUNTER — Emergency Department (HOSPITAL_BASED_OUTPATIENT_CLINIC_OR_DEPARTMENT_OTHER)
Admission: EM | Admit: 2024-04-24 | Discharge: 2024-04-24 | Discharge status: Against Medical Advice | Attending: Emergency Medicine | Admitting: Emergency Medicine

## 2024-04-24 ENCOUNTER — Other Ambulatory Visit: Payer: Self-pay

## 2024-04-24 ENCOUNTER — Encounter (HOSPITAL_BASED_OUTPATIENT_CLINIC_OR_DEPARTMENT_OTHER): Payer: Self-pay | Admitting: Emergency Medicine

## 2024-04-24 DIAGNOSIS — I700 Atherosclerosis of aorta: Secondary | ICD-10-CM | POA: Insufficient documentation

## 2024-04-24 DIAGNOSIS — R059 Cough, unspecified: Secondary | ICD-10-CM | POA: Diagnosis not present

## 2024-04-24 DIAGNOSIS — R000 Tachycardia, unspecified: Secondary | ICD-10-CM | POA: Insufficient documentation

## 2024-04-24 DIAGNOSIS — R197 Diarrhea, unspecified: Secondary | ICD-10-CM | POA: Insufficient documentation

## 2024-04-24 DIAGNOSIS — F32A Depression, unspecified: Secondary | ICD-10-CM | POA: Insufficient documentation

## 2024-04-24 DIAGNOSIS — E871 Hypo-osmolality and hyponatremia: Secondary | ICD-10-CM | POA: Insufficient documentation

## 2024-04-24 DIAGNOSIS — F419 Anxiety disorder, unspecified: Secondary | ICD-10-CM | POA: Insufficient documentation

## 2024-04-24 DIAGNOSIS — I10 Essential (primary) hypertension: Secondary | ICD-10-CM | POA: Insufficient documentation

## 2024-04-24 DIAGNOSIS — Z91040 Latex allergy status: Secondary | ICD-10-CM | POA: Insufficient documentation

## 2024-04-24 DIAGNOSIS — R9431 Abnormal electrocardiogram [ECG] [EKG]: Secondary | ICD-10-CM | POA: Insufficient documentation

## 2024-04-24 DIAGNOSIS — M797 Fibromyalgia: Secondary | ICD-10-CM | POA: Insufficient documentation

## 2024-04-24 DIAGNOSIS — I252 Old myocardial infarction: Secondary | ICD-10-CM | POA: Insufficient documentation

## 2024-04-24 DIAGNOSIS — Z5321 Procedure and treatment not carried out due to patient leaving prior to being seen by health care provider: Secondary | ICD-10-CM | POA: Insufficient documentation

## 2024-04-24 DIAGNOSIS — Z7982 Long term (current) use of aspirin: Secondary | ICD-10-CM | POA: Insufficient documentation

## 2024-04-24 DIAGNOSIS — N3000 Acute cystitis without hematuria: Secondary | ICD-10-CM | POA: Diagnosis not present

## 2024-04-24 LAB — CBC WITH DIFFERENTIAL/PLATELET
Abs Immature Granulocytes: 0.03 K/uL (ref 0.00–0.07)
Basophils Absolute: 0.1 K/uL (ref 0.0–0.1)
Basophils Relative: 1 %
Eosinophils Absolute: 0.1 K/uL (ref 0.0–0.5)
Eosinophils Relative: 1 %
HCT: 39.4 % (ref 36.0–46.0)
Hemoglobin: 13.7 g/dL (ref 12.0–15.0)
Immature Granulocytes: 0 %
Lymphocytes Relative: 27 %
Lymphs Abs: 2 K/uL (ref 0.7–4.0)
MCH: 32.5 pg (ref 26.0–34.0)
MCHC: 34.8 g/dL (ref 30.0–36.0)
MCV: 93.6 fL (ref 80.0–100.0)
Monocytes Absolute: 0.5 K/uL (ref 0.1–1.0)
Monocytes Relative: 7 %
Neutro Abs: 4.7 K/uL (ref 1.7–7.7)
Neutrophils Relative %: 64 %
Platelets: 417 K/uL — ABNORMAL HIGH (ref 150–400)
RBC: 4.21 MIL/uL (ref 3.87–5.11)
RDW: 13.6 % (ref 11.5–15.5)
WBC: 7.4 K/uL (ref 4.0–10.5)
nRBC: 0 % (ref 0.0–0.2)

## 2024-04-24 LAB — URINALYSIS, ROUTINE W REFLEX MICROSCOPIC
Glucose, UA: NEGATIVE mg/dL
Ketones, ur: 40 mg/dL — AB
Nitrite: NEGATIVE
Protein, ur: NEGATIVE mg/dL
Specific Gravity, Urine: 1.015 (ref 1.005–1.030)
pH: 5.5 (ref 5.0–8.0)

## 2024-04-24 LAB — URINALYSIS, MICROSCOPIC (REFLEX)

## 2024-04-24 LAB — COMPREHENSIVE METABOLIC PANEL WITH GFR
ALT: 37 U/L (ref 0–44)
AST: 34 U/L (ref 15–41)
Albumin: 4.9 g/dL (ref 3.5–5.0)
Alkaline Phosphatase: 139 U/L — ABNORMAL HIGH (ref 38–126)
Anion gap: 14 (ref 5–15)
BUN: 10 mg/dL (ref 8–23)
CO2: 25 mmol/L (ref 22–32)
Calcium: 9.5 mg/dL (ref 8.9–10.3)
Chloride: 92 mmol/L — ABNORMAL LOW (ref 98–111)
Creatinine, Ser: 0.94 mg/dL (ref 0.44–1.00)
GFR, Estimated: >60 mL/min (ref >60)
Glucose, Bld: 128 mg/dL — ABNORMAL HIGH (ref 70–99)
Potassium: 4.3 mmol/L (ref 3.5–5.1)
Sodium: 130 mmol/L — ABNORMAL LOW (ref 135–145)
Total Bilirubin: 0.6 mg/dL (ref 0.0–1.2)
Total Protein: 7.9 g/dL (ref 6.5–8.1)

## 2024-04-24 LAB — RESP PANEL BY RT-PCR (RSV, FLU A&B, COVID)  RVPGX2
Influenza A by PCR: NEGATIVE
Influenza B by PCR: NEGATIVE
Resp Syncytial Virus by PCR: NEGATIVE
SARS Coronavirus 2 by RT PCR: NEGATIVE

## 2024-04-24 LAB — TROPONIN T, HIGH SENSITIVITY
Troponin T High Sensitivity: <15 ng/L (ref <19)
Troponin T High Sensitivity: <15 ng/L (ref <19)

## 2024-04-24 LAB — LIPASE, BLOOD: Lipase: 20 U/L (ref 11–51)

## 2024-04-24 MED ORDER — CEPHALEXIN 500 MG PO CAPS
500.0000 mg | ORAL_CAPSULE | Freq: Three times a day (TID) | ORAL | 0 refills | Status: DC
Start: 2024-04-24 — End: 2024-05-05

## 2024-04-24 MED ORDER — KETOROLAC TROMETHAMINE 15 MG/ML IJ SOLN
15.0000 mg | Freq: Once | INTRAMUSCULAR | Status: AC
  Administered 2024-04-24: 15 mg via INTRAVENOUS
  Filled 2024-04-24: qty 1

## 2024-04-24 MED ORDER — TRIMETHOBENZAMIDE HCL 100 MG/ML IM SOLN: 200.0000 mg | Freq: Once | INTRAMUSCULAR | Status: DC

## 2024-04-24 MED ORDER — FAMOTIDINE 20 MG PO TABS
20.0000 mg | ORAL_TABLET | Freq: Once | ORAL | Status: AC
  Administered 2024-04-24: 20 mg via ORAL
  Filled 2024-04-24: qty 1

## 2024-04-24 MED ORDER — SODIUM CHLORIDE 0.9 % IV BOLUS: 1000.0000 mL | Freq: Once | INTRAVENOUS | Status: DC

## 2024-04-24 MED ORDER — SODIUM CHLORIDE 0.9 % IV BOLUS
1000.0000 mL | Freq: Once | INTRAVENOUS | Status: AC
  Administered 2024-04-24: 1000 mL via INTRAVENOUS

## 2024-04-24 MED ORDER — IOHEXOL 350 MG/ML SOLN
100.0000 mL | Freq: Once | INTRAVENOUS | Status: AC | PRN
  Administered 2024-04-24: 100 mL via INTRAVENOUS

## 2024-04-24 MED ORDER — SODIUM CHLORIDE 0.9 % IV SOLN
1.0000 g | Freq: Once | INTRAVENOUS | Status: AC
  Administered 2024-04-24: 1 g via INTRAVENOUS
  Filled 2024-04-24: qty 10

## 2024-04-24 MED ORDER — HYDRALAZINE HCL 20 MG/ML IJ SOLN
20.0000 mg | Freq: Once | INTRAMUSCULAR | Status: AC
  Administered 2024-04-24: 20 mg via INTRAVENOUS
  Filled 2024-04-24: qty 1

## 2024-04-24 MED ORDER — OXYCODONE HCL 5 MG PO TABS
5.0000 mg | ORAL_TABLET | Freq: Once | ORAL | Status: AC
  Administered 2024-04-24: 5 mg via ORAL
  Filled 2024-04-24: qty 1

## 2024-04-24 MED ORDER — DROPERIDOL 2.5 MG/ML IJ SOLN
0.6250 mg | Freq: Once | INTRAMUSCULAR | Status: AC
  Administered 2024-04-24: 0.625 mg via INTRAVENOUS
  Filled 2024-04-24: qty 2

## 2024-04-24 MED ORDER — ALUM & MAG HYDROXIDE-SIMETH 200-200-20 MG/5ML PO SUSP
30.0000 mL | Freq: Once | ORAL | Status: AC
  Administered 2024-04-24: 30 mL via ORAL
  Filled 2024-04-24: qty 30

## 2024-04-24 MED ORDER — HYDROMORPHONE HCL 1 MG/ML IJ SOLN
1.0000 mg | Freq: Once | INTRAMUSCULAR | Status: AC
  Administered 2024-04-24: 1 mg via INTRAVENOUS
  Filled 2024-04-24: qty 1

## 2024-04-24 MED ORDER — MAGNESIUM SULFATE 2 GM/50ML IV SOLN: 2.0000 g | Freq: Once | INTRAVENOUS | Status: DC

## 2024-04-24 NOTE — ED Provider Notes (Signed)
 [place] [facility]MENT AT [facility] Provider Note   CSN: [id number] Arrival date & time: 04/24/24  1233     Patient presents with: Abdominal Pain and Diarrhea   Sheryl Stone is a 68 y.o. female history of anxiety, depression, fibromyalgia, panic attacks presents with complaints of nausea without emesis, diarrhea x 2 weeks. patient reports symptoms have worsened in the past day.  Does have history of cholecystectomy and laparoscopic hysterectomy.  She denies any urinary or vaginal symptoms.  Symptoms are worse after eating.  She denies any chest pain or shortness of breath.  She does report intermittent cough, no sore throat or congestion.  Although she does report remote history of MI.   Abdominal Pain Associated symptoms: diarrhea   Diarrhea Associated symptoms: abdominal pain    Past Medical History:  Diagnosis Date   Anxiety and depression    Back pain    Fibromyalgia    Hypertension    Panic attacks        Prior to Admission medications   Medication Sig Start Date End Date Taking? Authorizing Provider  cephALEXin  (KEFLEX ) 500 MG capsule Take 1 capsule (500 mg total) by mouth 3 (three) times daily. 04/24/24  Yes Donnajean Lynwood DEL, PA-C  ALPRAZolam (XANAX) 0.25 MG tablet Take 0.25 mg by mouth at bedtime as needed for anxiety.    [provider]  aspirin 81 MG tablet Take 81 mg by mouth daily.    [provider]  Cholecalciferol ([employer] VIT D 5000 HIGH-POTENCY PO) Take by mouth.    [provider]  ezetimibe (ZETIA) 10 MG tablet Take 10 mg by mouth daily.    [provider]  lisinopril (ZESTRIL) 5 MG tablet Take 5 mg by mouth daily.    [provider]  metoprolol succinate (TOPROL-XL) 25 MG 24 hr tablet Take 25 mg by mouth daily.    [provider]  Multiple Vitamin (MULTIVITAMIN) tablet Take 1 tablet by mouth daily.    [provider]  pantoprazole  (PROTONIX ) 40 MG tablet Take 40 mg by  mouth daily.    [provider]  senna-docusate (SENOKOT-S) 8.6-50 MG tablet Take 1 tablet by mouth at bedtime. 09/23/17   Mackuen, Courteney Lyn, MD    Allergies: Latex, Codeine, Dextromethorphan, Diazepam, Diphenhydramine  hcl, Doxycycline, Elemental sulfur, Moxifloxacin, Nitrofurantoin, Penicillins, Propoxyphene, Pseudoephedrine, Sulfa antibiotics, Sulfamethoxazole, Sulfur, Tetracycline hcl, Tramadol, and Morphine    Review of Systems  Gastrointestinal:  Positive for abdominal pain and diarrhea.    Updated Vital Signs BP (!) 154/58   Pulse (!) 138   Temp 98.3 F (36.8 C) (Oral)   Resp (!) 25   Ht 5' 2 (1.575 m)   Wt 68 kg   SpO2 94%   BMI 27.44 kg/m   Physical Exam Vitals and nursing note reviewed.  Constitutional:      General: She is not in acute distress.    Appearance: She is well-developed.  HENT:     Head: Normocephalic and atraumatic.  Eyes:     Conjunctiva/sclera: Conjunctivae normal.  Cardiovascular:     Rate and Rhythm: Normal rate and regular rhythm.     Heart sounds: No murmur heard. Pulmonary:     Effort: Pulmonary effort is normal. No respiratory distress.     Breath sounds: Normal breath sounds.  Abdominal:     Palpations: Abdomen is soft.     Tenderness: There is abdominal tenderness.     Comments: Generalized abdominal tenderness, soft nondistended  Musculoskeletal:  General: No swelling.     Cervical back: Neck supple.  Skin:    General: Skin is warm and dry.     Capillary Refill: Capillary refill takes less than 2 seconds.  Neurological:     Mental Status: She is alert.  Psychiatric:        Mood and Affect: Mood normal.     (all labs ordered are listed, but only abnormal results are displayed) Labs Reviewed  COMPREHENSIVE METABOLIC PANEL WITH GFR - Abnormal; Notable for the following components:      Result Value   Sodium 130 (*)    Chloride 92 (*)    Glucose, Bld 128 (*)    Alkaline Phosphatase 139 (*)    All other  components within normal limits  URINALYSIS, ROUTINE W REFLEX MICROSCOPIC - Abnormal; Notable for the following components:   Color, Urine STRAW (*)    APPearance HAZY (*)    Hgb urine dipstick SMALL (*)    Bilirubin Urine SMALL (*)    Ketones, ur 40 (*)    Leukocytes,Ua MODERATE (*)    All other components within normal limits  CBC WITH DIFFERENTIAL/PLATELET - Abnormal; Notable for the following components:   Platelets 417 (*)    All other components within normal limits  URINALYSIS, MICROSCOPIC (REFLEX) - Abnormal; Notable for the following components:   Bacteria, UA MANY (*)    All other components within normal limits  RESP PANEL BY RT-PCR (RSV, FLU A&B, COVID)  RVPGX2  URINE CULTURE  CULTURE, BLOOD (ROUTINE X 2)  CULTURE, BLOOD (ROUTINE X 2)  LIPASE, BLOOD  LACTIC ACID, PLASMA  LACTIC ACID, PLASMA  TSH  T4, FREE  TROPONIN T, HIGH SENSITIVITY  TROPONIN T, HIGH SENSITIVITY    EKG: EKG Interpretation Date/Time:  Friday April 24 2024 19:48:59 EDT Ventricular Rate:  123 PR Interval:  126 QRS Duration:  103 QT Interval:  372 QTC Calculation: 533 R Axis:   37  Text Interpretation: Sinus tachycardia Abnormal R-wave progression, early transition Abnormal T, consider ischemia, diffuse leads Prolonged QT interval Since prior ECG< rate has increased Confirmed by Dreama Longs ([id number]) on 04/24/2024 8:07:35 PM  Radiology: CT Angio Chest/Abd/Pel for Dissection W and/or Wo Contrast Result Date: 04/24/2024 CLINICAL DATA:  Concern for acute aortic syndrome. EXAM: CT ANGIOGRAPHY CHEST, ABDOMEN AND PELVIS TECHNIQUE: Non-contrast CT of the chest was initially obtained. Multidetector CT imaging through the chest, abdomen and pelvis was performed using the standard protocol during bolus administration of intravenous contrast. Multiplanar reconstructed images and MIPs were obtained and reviewed to evaluate the vascular anatomy. RADIATION DOSE REDUCTION: This exam was performed according to  the departmental dose-optimization program which includes automated exposure control, adjustment of the mA and/or kV according to patient size and/or use of iterative reconstruction technique. CONTRAST:  100mL OMNIPAQUE  IOHEXOL  350 MG/ML SOLN COMPARISON:  None Available. FINDINGS: CTA CHEST FINDINGS Cardiovascular: Noncontrast imaging of the chest demonstrates no intramural hematoma within the thoracic aorta. Contrast enhanced imaging demonstrates no aneurysm or dissection of the thoracic aorta. Great vessels are normal. There is intimal calcification of the aorta. Mediastinum/Nodes: No axillary or supraclavicular adenopathy. No mediastinal or hilar adenopathy. No pericardial fluid. Esophagus normal. Lungs/Pleura: No pulmonary infarction. No pneumonia. No pleural fluid. No pneumothorax Musculoskeletal: No aggressive osseous lesion. Review of the MIP images confirms the above findings. CTA ABDOMEN AND PELVIS FINDINGS VASCULAR Aorta: Normal caliber aorta without aneurysm, dissection, vasculitis or significant stenosis. Celiac: Patent without evidence of aneurysm, dissection, vasculitis or significant stenosis. SMA:  Patent without evidence of aneurysm, dissection, vasculitis or significant stenosis. Renals: Both renal arteries are patent without evidence of aneurysm, dissection, vasculitis, fibromuscular dysplasia or significant stenosis. IMA: Patent without evidence of aneurysm, dissection, vasculitis or significant stenosis. Inflow: Patent without evidence of aneurysm, dissection, vasculitis or significant stenosis. Veins: No obvious venous abnormality within the limitations of this arterial phase study. Review of the MIP images confirms the above findings. NON-VASCULAR Hepatobiliary: No focal hepatic lesion. Postcholecystectomy. Dilatation the common bile duct following cholecystectomy is common. Pancreas: Pancreas is normal. No ductal dilatation. No pancreatic inflammation. Spleen: Normal spleen Adrenals/urinary  tract: Adrenal glands and kidneys are normal. The ureters and bladder normal. Stomach/Bowel: Stomach, small bowel, appendix, and cecum are normal. The colon and rectosigmoid colon are normal. Vascular/Lymphatic: Abdominal aorta is normal caliber. No periportal or retroperitoneal adenopathy. No pelvic adenopathy. Reproductive: Post hysterectomy.  Adnexa unremarkable Other: No free fluid. Musculoskeletal: No aggressive osseous lesion. Review of the MIP images confirms the above findings. IMPRESSION: 1. No evidence of thoracic aortic dissection or aneurysm. 2. No evidence of abdominal aortic dissection or aneurysm. 3. No evidence of pulmonary embolism. 4. No acute findings in the chest, abdomen, or pelvis. 5. Dilatation of the common bile duct following cholecystectomy. Favor physiologic dilatation. 6.  Aortic Atherosclerosis (ICD10-I70.0). Electronically Signed   By: Jackquline Boxer M.D.   On: 04/24/2024 16:17   DG Chest 2 View Result Date: 04/24/2024 CLINICAL DATA:  Cough, diarrhea, generalized abdominal pain EXAM: CHEST - 2 VIEW COMPARISON:  03/01/2024 FINDINGS: Frontal and lateral views of the chest demonstrate an unremarkable cardiac silhouette. No acute airspace disease, effusion, or pneumothorax. Stable linear scarring at the left lung base. No acute bony abnormalities. IMPRESSION: 1. Stable chest, no acute process. Electronically Signed   By: Ozell Daring M.D.   On: 04/24/2024 15:12     Procedures   Medications Ordered in the ED  sodium chloride  0.9 % bolus 1,000 mL (has no administration in time range)  magnesium  sulfate IVPB 2 g 50 mL (has no administration in time range)  ketorolac  (TORADOL ) 15 MG/ML injection 15 mg (15 mg Intravenous Given 04/24/24 1456)  sodium chloride  0.9 % bolus 1,000 mL (0 mLs Intravenous Stopped 04/24/24 1735)  alum & mag hydroxide-simeth (MAALOX/MYLANTA) 200-200-20 MG/5ML suspension 30 mL (30 mLs Oral Given 04/24/24 1456)  droperidol  (INAPSINE ) 2.5 MG/ML injection 0.625 mg  (0.625 mg Intravenous Given 04/24/24 1552)  oxyCODONE  (Oxy IR/ROXICODONE ) immediate release tablet 5 mg (5 mg Oral Given 04/24/24 1554)  HYDROmorphone  (DILAUDID ) injection 1 mg (1 mg Intravenous Given 04/24/24 1526)  iohexol  (OMNIPAQUE ) 350 MG/ML injection 100 mL (100 mLs Intravenous Contrast Given 04/24/24 1544)  cefTRIAXone  (ROCEPHIN ) 1 g in sodium chloride  0.9 % 100 mL IVPB (0 g Intravenous Stopped 04/24/24 1823)  hydrALAZINE  (APRESOLINE ) injection 20 mg (20 mg Intravenous Given 04/24/24 1633)  famotidine  (PEPCID ) tablet 20 mg (20 mg Oral Given 04/24/24 1633)                                    Medical Decision Making Amount and/or Complexity of Data Reviewed Labs: ordered.   This patient presents to the ED with chief complaint(s) of abdominal pain.  The complaint involves an extensive differential diagnosis and also carries with it a high risk of complications and morbidity.   Pertinent past medical history as listed in HPI  The differential diagnosis includes  ACS, aortic dissection, pancreatitis, gastroenteritis, PUD, appendicitis  Additional history obtained: Records reviewed Care Everywhere/External Records  Assessment and management:   Patient presents hypertensive with complaints of abdominal pain x 2 weeks that is worsened in the past day.  Is associate with nausea without vomiting.  Is having persistent brown to yellow diarrhea.  No urinary or vaginal symptoms.  She does have a history of cholecystectomy and hysterectomy.  On exam she has generalized abdominal tenderness.,  Soft nondistended.  She does endorse intermittent cough.  No recent sick contacts.  Her lungs are clear.  Has no respiratory issues.  She denies any chest pain or shortness of breath.  Does have remote history of MI.  Reports that she did not take her blood pressure medication today.  Will obtain routine labs and CT abdomen pelvis.  UA consistent with UTI.  Will give Rocephin .   Patient noted to become more uncomfortable  with worsening abdominal pain. BP noted to be notably elevated on her left arm 215/150.  Right arm with systolics in the 150s.  Patient taken to CT for dissection study.  She has no history of aneurysm.  POCUS exam severely limited due to body habitus and bowel gas.  Reassuringly patient did have a dissection study back in March which was without any evidence of aneurysm or dissection.  Dissection study negative.  No acute abnormality noted.  Patient does reports some persistent nausea.  Pain is down to 5 out of 10.  She is able to ambulate and tolerate p.o.  Her heart rate however still tachycardic and reaches up to 130 with ambulation will administer further fluids and obtain additional labs.  Patient is refusing additional labs and fluids.  She states she feels better and she wants to go home.  I explained to her that I am concerned about her going home with heart rate of 138 and respirations of 25.  Patient is adamant that she would like to leave.  I have reviewed the risks of doing so and I have strongly recommended her staying and being admitted to the hospital.  Patient is understanding and would still like to go.  Will send in prescription for Keflex  to her pharmacy for UTI.  I provided strict return precautions.  She has a PCP follow-up on Monday.  Independent ECG interpretation:  Sinus rhythm, nonspecific T wave abnormalities unchanged, abnormal R wave progression, prolonged QT  Independent labs interpretation:  The following labs were independently interpreted:  CBC without significant abnormality, CMP with sodium of 130, alk phos of 139 UA with moderate leukocytes, negative nitrites, small bilirubin, many bacteria 11-20 WBCs, troponin without elevation, respiratory panel negative  Independent visualization and interpretation of imaging: I independently visualized the following imaging with scope of interpretation limited to determining acute life threatening conditions related to  emergency care:  CT abdomen pelvis dissection study Chest x-ray no acute abnormality  Consultations obtained:   Pharmacy recommended very small dose of droperidol  0.625 for nausea in the setting of prolonged QTc.  Tigan  is not available.  Disposition:   Patient will be leaving AMA.  Social Determinants of Health:   none  This note was dictated with voice recognition software.  Despite best efforts at proofreading, errors may have occurred which can change the documentation meaning.       Final diagnoses:  Acute cystitis without hematuria    ED Discharge Orders          Ordered    cephALEXin  (KEFLEX ) 500 MG capsule  3 times daily  04/24/24 2016               Donnajean Lynwood DEL, PA-C 04/24/24 2018    Tegeler, Lonni PARAS, MD 04/25/24 [phone]

## 2024-04-24 NOTE — ED Notes (Signed)
 Patient refused additional labs and IV fluids stating she wants to go home.  She did consent to one more EKG.  PA aware and was present for that conversation.

## 2024-04-24 NOTE — ED Notes (Signed)
 ED Provider at bedside.

## 2024-04-24 NOTE — ED Notes (Signed)
Patient transported to CT on monitor with RN

## 2024-04-24 NOTE — ED Triage Notes (Signed)
 Generalized abdominal pain, severe diarrhea, decreased po intake x 2 weeks. No bloody stools. Reports hx of ulcerative colitis.

## 2024-04-24 NOTE — ED Notes (Signed)
 Pt left AMA, AMA formed signed by pt, discussed reasons she should stay and was spoke to by provider.  Pt states wants to leave.  Informed pt to return to ER for any new or worsening symptoms. Pt given paperwork from provider and reviewed Rx that was sent for UTI. Pt verbalized understanding.  Pt ambulatory with spouse to POV to Home

## 2024-04-24 NOTE — Discharge Instructions (Addendum)
 Your evaluated in the emergency room for abdominal pain.  You are found to have a UTI.  A prescription for Keflex, antibiotic was sent to your pharmacy.  Please be sure to complete the full course of antibiotics.  Please follow-up with your primary care doctor on Monday as discussed.  Please return the emergency room if you experience any worsening symptoms including fevers, chill, worsening pain.

## 2024-04-24 NOTE — ED Notes (Signed)
 Pt given sprite at this time for PO challenge per [place], [place].

## 2024-04-25 LAB — URINE CULTURE: Culture: NO GROWTH

## 2024-05-04 ENCOUNTER — Ambulatory Visit: Admitting: Physician Assistant

## 2024-05-05 ENCOUNTER — Encounter: Payer: Self-pay | Admitting: Physician Assistant

## 2024-05-05 ENCOUNTER — Ambulatory Visit (INDEPENDENT_AMBULATORY_CARE_PROVIDER_SITE_OTHER): Admitting: Physician Assistant

## 2024-05-05 VITALS — BP 166/82 | HR 81 | Temp 97.8°F | Resp 18 | Ht 62.0 in | Wt 148.6 lb

## 2024-05-05 DIAGNOSIS — R739 Hyperglycemia, unspecified: Secondary | ICD-10-CM | POA: Diagnosis not present

## 2024-05-05 DIAGNOSIS — I2510 Atherosclerotic heart disease of native coronary artery without angina pectoris: Secondary | ICD-10-CM | POA: Insufficient documentation

## 2024-05-05 DIAGNOSIS — H5711 Ocular pain, right eye: Secondary | ICD-10-CM

## 2024-05-05 DIAGNOSIS — I252 Old myocardial infarction: Secondary | ICD-10-CM | POA: Diagnosis not present

## 2024-05-05 DIAGNOSIS — K219 Gastro-esophageal reflux disease without esophagitis: Secondary | ICD-10-CM | POA: Insufficient documentation

## 2024-05-05 DIAGNOSIS — F419 Anxiety disorder, unspecified: Secondary | ICD-10-CM | POA: Insufficient documentation

## 2024-05-05 DIAGNOSIS — I10 Essential (primary) hypertension: Secondary | ICD-10-CM | POA: Insufficient documentation

## 2024-05-05 DIAGNOSIS — H1131 Conjunctival hemorrhage, right eye: Secondary | ICD-10-CM

## 2024-05-05 NOTE — Assessment & Plan Note (Signed)
 Uncontrolled, long term history of TID xanax  0.25 use.  Requesting klonopin tid.  History of intolerance to SSRIs (Lexapro, Zoloft, Prozac). But does not elaborate. Advised against regular use of benzodiazepines like Xanax  and Klonopin for anxiety management. Recommended consultation with a psychiatrist for alternative treatments, but she declined referral.  Would be up to new provider to continue treatment but advised I do not use xanax  on a regular basis to treat anxiety. Given her long-term use, would not be ideal to abruptly d/c, but plan would be to slowly taper off.

## 2024-05-05 NOTE — Assessment & Plan Note (Signed)
 Uncontrolled. Pt reports white coat htn. Recommend checking at home and f/b in 1 month with new provider. Manages with metop 25 mg xr and lisinopril 5 mg daily. Ordering cmp

## 2024-05-05 NOTE — Progress Notes (Signed)
 New patient visit   Patient: Sheryl Stone   DOB: 13-Nov-1955   68 y.o. Female  MRN: [id number] Visit Date: 05/05/2024  Today's healthcare provider: Manuelita Flatness, PA-C   Cc. New pt, establish care  Subjective    Sheryl Stone is a 67 y.o. female who presents today as a new patient to establish care.   Discussed the use of AI scribe software for clinical note transcription with the patient, who gave verbal consent to proceed.  History of Present Illness   Sheryl Stone is a 68 year old female with hypertension, hyperlipidemia, and fibromyalgia who presents for medication management and evaluation of right eye redness and pain.  Right eye redness about a week ago, accompanied by right eye pain, bilateral ear fullness, and right sided neck and shoulder soreness. Denies recent URI or vomiting.  She reports the eye is painful, especially behind it, and feels gritty when blinking. There is no recent illness or trauma associated .  She does not regularly monitor her blood pressure at home and has not had recent blood work.  She manages long standing, chronic , daily anxiety w/ panic attacks with xanax  0.25 mg TID.  She has experienced adverse effects with other anxiety medications in the past, although reports nothing specific. Her anxiety limits her social interactions. She suggests switching to klonopin 5 mg TID as this worked for her sister.  She uses nitroglycerin as needed for chest pain, approximately three times a year, following a myocardial infarction in 2022.       Past Medical History:  Diagnosis Date   Anxiety and depression    Back pain    Fibromyalgia    Hypertension    Panic attacks    Past Surgical History:  Procedure Laterality Date   PARTIAL HYSTERECTOMY     No family status information on file.   History reviewed. No pertinent family history. Social History   Socioeconomic History   Marital status: Married    Spouse name: Not on file    Number of children: Not on file   Years of education: Not on file   Highest education level: Not on file  Occupational History   Not on file  Tobacco Use   Smoking status: Former   Smokeless tobacco: Never  Substance and Sexual Activity   Alcohol use: No   Drug use: No   Sexual activity: Not on file  Other Topics Concern   Not on file  Social History Narrative   Not on file   Social Drivers of Health   Financial Resource Strain: Not on file  Food Insecurity: Not on file  Transportation Needs: Not on file  Physical Activity: Not on file  Stress: Not on file  Social Connections: Not on file   Outpatient Medications Prior to Visit  Medication Sig   ALPRAZolam  (XANAX ) 0.25 MG tablet Take 0.25 mg by mouth at bedtime as needed for anxiety.   aspirin 81 MG tablet Take 81 mg by mouth daily.   atorvastatin (LIPITOR) 80 MG tablet Take 80 mg by mouth daily.   Cholecalciferol ([employer] VIT D 5000 HIGH-POTENCY PO) Take by mouth.   ezetimibe (ZETIA) 10 MG tablet Take 10 mg by mouth daily.   lisinopril (ZESTRIL) 5 MG tablet Take 5 mg by mouth daily.   metoprolol succinate (TOPROL-XL) 25 MG 24 hr tablet Take 25 mg by mouth daily.   Multiple Vitamin (MULTIVITAMIN) tablet Take 1 tablet by mouth daily.   pantoprazole  (PROTONIX ) 40 MG  tablet Take 40 mg by mouth daily.   senna-docusate (SENOKOT-S) 8.6-50 MG tablet Take 1 tablet by mouth at bedtime.   [DISCONTINUED] cephALEXin  (KEFLEX ) 500 MG capsule Take 1 capsule (500 mg total) by mouth 3 (three) times daily.   No facility-administered medications prior to visit.   Allergies  Allergen Reactions   Latex Itching   Codeine Other (See Comments)    Other Reaction(s): Unknown   Dextromethorphan     Other Reaction(s): hyper   Diazepam     Other Reaction(s): Unknown   Diphenhydramine  Hcl     Other Reaction(s): Unknown   Doxycycline     Other Reaction(s): Unknown   Elemental Sulfur    Moxifloxacin     Other Reaction(s): diarrhea    Nitrofurantoin     Other Reaction(s): Unknown   Penicillins Other (See Comments)    All Cillins  Other Reaction(s): Unknown   Propoxyphene     Other Reaction(s): Unknown   Pseudoephedrine     Other Reaction(s): excess energy   Sulfa Antibiotics    Sulfamethoxazole     Other Reaction(s): Unknown   Sulfur Other (See Comments)   Tetracycline Hcl     Other Reaction(s): Unknown   Tramadol Other (See Comments)    Hyper sedation  Other Reaction(s): Unknown   Morphine Dermatitis     There is no immunization history on file for this patient.  Health Maintenance  Topic Date Due   Medicare Annual Wellness (AWV)  Never done   DTaP/Tdap/Td (1 - Tdap) Never done   Pneumococcal Vaccine: 50+ Years (1 of 2 - PCV) Never done   Zoster Vaccines- Shingrix (1 of 2) Never done   MAMMOGRAM  Never done   DEXA SCAN  12/29/2018   COVID-19 Vaccine (2 - Janssen risk series) 02/01/2020   INFLUENZA VACCINE  04/24/2024   Colonoscopy  04/26/2029   Hepatitis C Screening  Completed   Hepatitis B Vaccines  Aged Out   HPV VACCINES  Aged Out   Meningococcal B Vaccine  Aged Out    Patient Care Team: Merrianne Robbi BROCKS, NP as PCP - General  Review of Systems  Constitutional:  Negative for fatigue and fever.  Respiratory:  Negative for cough and shortness of breath.   Cardiovascular:  Positive for chest pain. Negative for leg swelling.  Gastrointestinal:  Negative for abdominal pain.  Neurological:  Negative for dizziness and headaches.  Psychiatric/Behavioral:  The patient is nervous/anxious.         Objective    BP (!) 166/82   Pulse 81   Temp 97.8 F (36.6 C)   Resp 18   Ht 5' 2 (1.575 m)   Wt 148 lb 9.6 oz (67.4 kg)   SpO2 100%   BMI 27.18 kg/m     Physical Exam Constitutional:      General: She is awake.     Appearance: She is well-developed.  HENT:     Head: Normocephalic.     Right Ear: Tympanic membrane normal.     Left Ear: Tympanic membrane normal.  Eyes:      Extraocular Movements: Extraocular movements intact.     Conjunctiva/sclera: Conjunctivae normal.     Pupils: Pupils are equal, round, and reactive to light.     Comments: Right inner eye with subconjunctival hemorrhage  Cardiovascular:     Rate and Rhythm: Normal rate and regular rhythm.     Heart sounds: Normal heart sounds.  Pulmonary:     Effort: Pulmonary effort is  normal.     Breath sounds: Normal breath sounds.  Skin:    General: Skin is warm.  Neurological:     Mental Status: She is alert and oriented to person, place, and time.  Psychiatric:        Attention and Perception: Attention normal.        Mood and Affect: Mood normal.        Speech: Speech normal.        Behavior: Behavior is cooperative.    Depression Screen    05/05/2024    2:01 PM  PHQ 2/9 Scores  PHQ - 2 Score 0   No results found for any visits on 05/05/24.  Assessment & Plan     Primary hypertension Assessment & Plan: Uncontrolled. Pt reports white coat htn. Recommend checking at home and f/b in 1 month with new provider. Manages with metop 25 mg xr and lisinopril 5 mg daily. Ordering cmp  Orders: -     CBC with Differential/Platelet -     Comprehensive metabolic panel with GFR -     Lipid panel  Hyperglycemia -     Hemoglobin A1c  History of MI (myocardial infarction) -     CBC with Differential/Platelet -     Comprehensive metabolic panel with GFR -     Lipid panel  Coronary artery disease involving native heart without angina pectoris, unspecified vessel or lesion type Assessment & Plan: Managed on lipitor 80 mg and zetia 10 mg. History of MI 2022. Follows annually with cardiology.  Repeat fasting lipids  Orders: -     CBC with Differential/Platelet -     Comprehensive metabolic panel with GFR -     Lipid panel  Anxiety disorder with panic attacks Assessment & Plan: Uncontrolled, long term history of TID xanax  0.25 use.  Requesting klonopin tid.  History of intolerance to  SSRIs (Lexapro, Zoloft, Prozac). But does not elaborate. Advised against regular use of benzodiazepines like Xanax  and Klonopin for anxiety management. Recommended consultation with a psychiatrist for alternative treatments, but she declined referral.  Would be up to new provider to continue treatment but advised I do not use xanax  on a regular basis to treat anxiety. Given her long-term use, would not be ideal to abruptly d/c, but plan would be to slowly taper off.   Acute right eye pain -     Ambulatory referral to Ophthalmology  Non-traumatic subconjunctival hemorrhage of right eye  Unusually associated with pain  Referring to optho    Return in about 4 weeks (around 06/02/2024) for hypertension.      Manuelita Flatness, PA-C  [facility] at [facility] [phone] (phone) [phone] (fax)  [facility]

## 2024-05-05 NOTE — Assessment & Plan Note (Signed)
 Managed on lipitor 80 mg and zetia 10 mg. History of MI 2022. Follows annually with cardiology.  Repeat fasting lipids

## 2024-05-06 ENCOUNTER — Ambulatory Visit: Payer: Self-pay | Admitting: Physician Assistant

## 2024-05-06 ENCOUNTER — Other Ambulatory Visit: Payer: Self-pay | Admitting: Physician Assistant

## 2024-05-06 DIAGNOSIS — D649 Anemia, unspecified: Secondary | ICD-10-CM | POA: Insufficient documentation

## 2024-05-06 DIAGNOSIS — E1165 Type 2 diabetes mellitus with hyperglycemia: Secondary | ICD-10-CM | POA: Insufficient documentation

## 2024-05-06 LAB — CBC WITH DIFFERENTIAL/PLATELET
Basophils Absolute: 0.1 K/uL (ref 0.0–0.1)
Basophils Relative: 1.1 % (ref 0.0–3.0)
Eosinophils Absolute: 0.2 K/uL (ref 0.0–0.7)
Eosinophils Relative: 2.9 % (ref 0.0–5.0)
HCT: 35.3 % — ABNORMAL LOW (ref 36.0–46.0)
Hemoglobin: 11.9 g/dL — ABNORMAL LOW (ref 12.0–15.0)
Lymphocytes Relative: 33.9 % (ref 12.0–46.0)
Lymphs Abs: 2.8 K/uL (ref 0.7–4.0)
MCHC: 33.8 g/dL (ref 30.0–36.0)
MCV: 96.1 fl (ref 78.0–100.0)
Monocytes Absolute: 0.5 K/uL (ref 0.1–1.0)
Monocytes Relative: 6.5 % (ref 3.0–12.0)
Neutro Abs: 4.6 K/uL (ref 1.4–7.7)
Neutrophils Relative %: 55.6 % (ref 43.0–77.0)
Platelets: 411 K/uL — ABNORMAL HIGH (ref 150.0–400.0)
RBC: 3.67 Mil/uL — ABNORMAL LOW (ref 3.87–5.11)
RDW: 14.5 % (ref 11.5–15.5)
WBC: 8.2 K/uL (ref 4.0–10.5)

## 2024-05-06 LAB — COMPREHENSIVE METABOLIC PANEL WITH GFR
ALT: 27 U/L (ref 0–35)
AST: 18 U/L (ref 0–37)
Albumin: 4.5 g/dL (ref 3.5–5.2)
Alkaline Phosphatase: 96 U/L (ref 39–117)
BUN: 9 mg/dL (ref 6–23)
CO2: 27 meq/L (ref 19–32)
Calcium: 9.4 mg/dL (ref 8.4–10.5)
Chloride: 93 meq/L — ABNORMAL LOW (ref 96–112)
Creatinine, Ser: 0.76 mg/dL (ref 0.40–1.20)
GFR: 79.43 mL/min (ref >60.00)
Glucose, Bld: 90 mg/dL (ref 70–99)
Potassium: 3.9 meq/L (ref 3.5–5.1)
Sodium: 131 meq/L — ABNORMAL LOW (ref 135–145)
Total Bilirubin: 0.4 mg/dL (ref 0.2–1.2)
Total Protein: 7.1 g/dL (ref 6.0–8.3)

## 2024-05-06 LAB — LIPID PANEL
Cholesterol: 156 mg/dL (ref 0–200)
HDL: 54.8 mg/dL (ref >39.00)
LDL Cholesterol: 65 mg/dL (ref 0–99)
NonHDL: 101.34
Total CHOL/HDL Ratio: 3
Triglycerides: 181 mg/dL — ABNORMAL HIGH (ref 0.0–149.0)
VLDL: 36.2 mg/dL (ref 0.0–40.0)

## 2024-05-06 LAB — HEMOGLOBIN A1C: Hgb A1c MFr Bld: 7 % — ABNORMAL HIGH (ref 4.6–6.5)

## 2024-05-06 MED ORDER — METFORMIN HCL ER 500 MG PO TB24: 500.0000 mg | ORAL_TABLET | Freq: Every day | ORAL | 1 refills | Status: DC

## 2024-05-07 ENCOUNTER — Ambulatory Visit (INDEPENDENT_AMBULATORY_CARE_PROVIDER_SITE_OTHER)

## 2024-05-07 ENCOUNTER — Ambulatory Visit: Payer: Self-pay | Admitting: Physician Assistant

## 2024-05-07 DIAGNOSIS — D649 Anemia, unspecified: Secondary | ICD-10-CM

## 2024-05-07 LAB — IBC + FERRITIN
Ferritin: 136.2 ng/mL (ref 10.0–291.0)
Iron: 69 ug/dL (ref 42–145)
Saturation Ratios: 18.5 % — ABNORMAL LOW (ref 20.0–50.0)
TIBC: 373.8 ug/dL (ref 250.0–450.0)
Transferrin: 267 mg/dL (ref 212.0–360.0)

## 2024-05-07 LAB — FOLATE: Folate: >23.4 ng/mL (ref >5.9)

## 2024-05-07 LAB — VITAMIN B12: Vitamin B-12: 459 pg/mL (ref 211–911)

## 2024-05-08 ENCOUNTER — Ambulatory Visit: Payer: Self-pay | Admitting: Physician Assistant

## 2024-05-11 ENCOUNTER — Other Ambulatory Visit (INDEPENDENT_AMBULATORY_CARE_PROVIDER_SITE_OTHER)

## 2024-05-11 ENCOUNTER — Telehealth: Payer: Self-pay | Admitting: Physician Assistant

## 2024-05-11 DIAGNOSIS — D649 Anemia, unspecified: Secondary | ICD-10-CM | POA: Diagnosis not present

## 2024-05-11 NOTE — Telephone Encounter (Signed)
 Pt came in needs alprazolam  and tizandine  refilled. Pt also stated it says take one tablet by mouth at bedtime as needed but mentioned she let pcp know she was going to take it more than one time at bedtime. Please call pt when refill has been sent in. Pt wants to pick up at [facility] in [place].

## 2024-05-12 ENCOUNTER — Other Ambulatory Visit: Payer: Self-pay | Admitting: Physician Assistant

## 2024-05-12 ENCOUNTER — Ambulatory Visit: Payer: Self-pay | Admitting: Physician Assistant

## 2024-05-12 ENCOUNTER — Telehealth: Payer: Self-pay | Admitting: *Deleted

## 2024-05-12 ENCOUNTER — Ambulatory Visit: Payer: Self-pay

## 2024-05-12 DIAGNOSIS — F419 Anxiety disorder, unspecified: Secondary | ICD-10-CM

## 2024-05-12 LAB — FECAL OCCULT BLOOD, IMMUNOCHEMICAL: Fecal Occult Bld: NEGATIVE

## 2024-05-12 MED ORDER — ALPRAZOLAM 0.25 MG PO TABS: 0.2500 mg | ORAL_TABLET | Freq: Three times a day (TID) | ORAL | 0 refills | Status: DC | PRN

## 2024-05-12 NOTE — Telephone Encounter (Unsigned)
 Copied from CRM [id number]. Topic: Clinical - Medication Question >> May 12, 2024  1:09 PM [place] wrote: Reason for CRM: Patient was taking .50 mg of ALPRAZolam  and now she is taking .25  3 (three) times daily as needed for anxiety. She thinks it was cut down too fast. She has a lot of stress due to her son passing away and causing her a lot of anxiety

## 2024-05-12 NOTE — Telephone Encounter (Signed)
 FYI Only or Action Required?: Action required by provider: declined apt, wants medication reordered but not rx'd by this PCP.  Would like bentyl and Tizanidine .  Patient was last seen in primary care on na.  Called Nurse Triage reporting Medication Refill.  Symptoms began today.  Interventions attempted: Nothing.  Symptoms are: stable.  Triage Disposition: Call PCP When Office is Open  Patient/caregiver understands and will follow disposition?: No, wishes to speak with PCP          Copied from CRM #[id number]. Topic: Clinical - Prescription Issue >> May 12, 2024  1:03 PM [place] E wrote: Reason for CRM: Patient called in stating she needed a refill of Tizanidine  and Bentyl. Could not locate these medications on med list, but patient stated she takes 4 mg of Tizanidine  every 8 hours as needed. Callback number for patient is [phone]. Reason for Disposition  [1] Prescription refill request for NON-ESSENTIAL medicine (i.e., no harm to patient if med not taken) AND [2] triager unable to refill per department policy  Answer Assessment - Initial Assessment Questions 1. DRUG NAME: What medicine do you need to have refilled?     Declines appointment; Tizanidine  and Bentyl.  Protocols used: Medication Refill and Renewal Call-A-AH

## 2024-05-14 ENCOUNTER — Other Ambulatory Visit: Payer: Self-pay | Admitting: Physician Assistant

## 2024-05-14 ENCOUNTER — Other Ambulatory Visit: Payer: Self-pay | Admitting: *Deleted

## 2024-05-14 DIAGNOSIS — F419 Anxiety disorder, unspecified: Secondary | ICD-10-CM

## 2024-05-14 MED ORDER — ALPRAZOLAM 0.5 MG PO TABS
0.5000 mg | ORAL_TABLET | Freq: Three times a day (TID) | ORAL | 0 refills | Status: DC | PRN
Start: 2024-05-14 — End: 2024-06-22

## 2024-05-14 MED ORDER — TIZANIDINE HCL 4 MG PO TABS: 4.0000 mg | ORAL_TABLET | Freq: Three times a day (TID) | ORAL | 1 refills | Status: DC | PRN

## 2024-05-14 NOTE — Telephone Encounter (Signed)
 Rx cancelled with pharmacy.  Pt advised new rx will be ready this afternoon.

## 2024-06-03 ENCOUNTER — Other Ambulatory Visit: Payer: Self-pay | Admitting: Physician Assistant

## 2024-06-03 NOTE — Telephone Encounter (Signed)
 FYI Only or Action Required?: Action required by provider: medication refill request.  Patient was last seen in primary care on 05/05/2024 by Cyndi Shaver, PA-C.  Called Nurse Triage reporting No chief complaint on file..  Symptoms began today.  Interventions attempted: Nothing.  Symptoms are: stable.  Triage Disposition: No disposition on file.  Patient/caregiver understands and will follow disposition?:

## 2024-06-03 NOTE — Telephone Encounter (Signed)
 Copied from CRM #[id number]. Topic: Clinical - Medication Refill >> Jun 03, 2024  9:14 AM Macario HERO wrote: Medication: tiZANidine  (ZANAFLEX ) 4 MG tablet [[id number]]  Has the patient contacted their pharmacy? Yes (Agent: If no, request that the patient contact the pharmacy for the refill. If patient does not wish to contact the pharmacy document the reason why and proceed with request.) (Agent: If yes, when and what did the pharmacy advise?)  This is the patient's preferred pharmacy:  [facility] [address] [place] - [address] [place] [address] Phone: [phone] Fax: [phone]  Is this the correct pharmacy for this prescription? Yes If no, delete pharmacy and type the correct one.   Has the prescription been filled recently? Yes  Is the patient out of the medication? Yes  Has the patient been seen for an appointment in the last year OR does the patient have an upcoming appointment? Yes  Can we respond through MyChart? No  Agent: Please be advised that Rx refills may take up to 3 business days. We ask that you follow-up with your pharmacy.

## 2024-06-18 ENCOUNTER — Telehealth: Payer: Self-pay

## 2024-06-18 NOTE — Telephone Encounter (Signed)
 LMOM for April, RN w/ [employer]- informed that Sheryl Stone has left our office, Pt has transfer of care appt on 07/06/24 w/ [facility] Mon, I would make [facility] aware that bone density should be completed. Asked April to call back if further questions/concerns.

## 2024-06-18 NOTE — Telephone Encounter (Signed)
 Copied from CRM [id number]. Topic: General - Other >> Jun 08, 2024  1:22 PM Tinnie BROCKS wrote: Reason for CRM: April RN with [facility] calling to confirm PCP name and fax for pt to fax over information on bone density testing. >> Jun 18, 2024 10:41 AM [place] F wrote: April from [place] called in asking if office had received a request for pt to have a bone density done. Please call back at [phone]

## 2024-06-21 ENCOUNTER — Emergency Department (HOSPITAL_BASED_OUTPATIENT_CLINIC_OR_DEPARTMENT_OTHER)
Admission: EM | Admit: 2024-06-21 | Discharge: 2024-06-21 | Disposition: A | Discharge status: Home / Self Care | Attending: Emergency Medicine | Admitting: Emergency Medicine

## 2024-06-21 ENCOUNTER — Other Ambulatory Visit: Payer: Self-pay

## 2024-06-21 ENCOUNTER — Encounter (HOSPITAL_BASED_OUTPATIENT_CLINIC_OR_DEPARTMENT_OTHER): Payer: Self-pay

## 2024-06-21 ENCOUNTER — Emergency Department (HOSPITAL_BASED_OUTPATIENT_CLINIC_OR_DEPARTMENT_OTHER)

## 2024-06-21 DIAGNOSIS — Z91040 Latex allergy status: Secondary | ICD-10-CM | POA: Insufficient documentation

## 2024-06-21 DIAGNOSIS — R110 Nausea: Secondary | ICD-10-CM | POA: Diagnosis not present

## 2024-06-21 DIAGNOSIS — Z7982 Long term (current) use of aspirin: Secondary | ICD-10-CM | POA: Insufficient documentation

## 2024-06-21 DIAGNOSIS — Z79899 Other long term (current) drug therapy: Secondary | ICD-10-CM | POA: Diagnosis not present

## 2024-06-21 DIAGNOSIS — R079 Chest pain, unspecified: Secondary | ICD-10-CM

## 2024-06-21 DIAGNOSIS — I10 Essential (primary) hypertension: Secondary | ICD-10-CM | POA: Diagnosis not present

## 2024-06-21 DIAGNOSIS — R1013 Epigastric pain: Secondary | ICD-10-CM | POA: Diagnosis present

## 2024-06-21 DIAGNOSIS — R0789 Other chest pain: Secondary | ICD-10-CM | POA: Diagnosis not present

## 2024-06-21 DIAGNOSIS — R0602 Shortness of breath: Secondary | ICD-10-CM | POA: Diagnosis not present

## 2024-06-21 DIAGNOSIS — R1011 Right upper quadrant pain: Secondary | ICD-10-CM | POA: Insufficient documentation

## 2024-06-21 DIAGNOSIS — R0600 Dyspnea, unspecified: Secondary | ICD-10-CM

## 2024-06-21 LAB — RESP PANEL BY RT-PCR (RSV, FLU A&B, COVID)  RVPGX2
Influenza A by PCR: NEGATIVE
Influenza B by PCR: NEGATIVE
Resp Syncytial Virus by PCR: NEGATIVE
SARS Coronavirus 2 by RT PCR: NEGATIVE

## 2024-06-21 LAB — COMPREHENSIVE METABOLIC PANEL WITH GFR
ALT: 27 U/L (ref 0–44)
AST: 28 U/L (ref 15–41)
Albumin: 4.6 g/dL (ref 3.5–5.0)
Alkaline Phosphatase: 115 U/L (ref 38–126)
Anion gap: 15 (ref 5–15)
BUN: 7 mg/dL — ABNORMAL LOW (ref 8–23)
CO2: 24 mmol/L (ref 22–32)
Calcium: 9.6 mg/dL (ref 8.9–10.3)
Chloride: 90 mmol/L — ABNORMAL LOW (ref 98–111)
Creatinine, Ser: 0.74 mg/dL (ref 0.44–1.00)
GFR, Estimated: >60 mL/min (ref >60)
Glucose, Bld: 130 mg/dL — ABNORMAL HIGH (ref 70–99)
Potassium: 4.3 mmol/L (ref 3.5–5.1)
Sodium: 130 mmol/L — ABNORMAL LOW (ref 135–145)
Total Bilirubin: 0.4 mg/dL (ref 0.0–1.2)
Total Protein: 7.9 g/dL (ref 6.5–8.1)

## 2024-06-21 LAB — URINALYSIS, ROUTINE W REFLEX MICROSCOPIC
Bilirubin Urine: NEGATIVE
Glucose, UA: NEGATIVE mg/dL
Hgb urine dipstick: NEGATIVE
Ketones, ur: NEGATIVE mg/dL
Leukocytes,Ua: NEGATIVE
Nitrite: NEGATIVE
Protein, ur: NEGATIVE mg/dL
Specific Gravity, Urine: 1.01 (ref 1.005–1.030)
pH: 7 (ref 5.0–8.0)

## 2024-06-21 LAB — CBC
HCT: 37 % (ref 36.0–46.0)
Hemoglobin: 12.7 g/dL (ref 12.0–15.0)
MCH: 32.5 pg (ref 26.0–34.0)
MCHC: 34.3 g/dL (ref 30.0–36.0)
MCV: 94.6 fL (ref 80.0–100.0)
Platelets: 439 K/uL — ABNORMAL HIGH (ref 150–400)
RBC: 3.91 MIL/uL (ref 3.87–5.11)
RDW: 14.8 % (ref 11.5–15.5)
WBC: 8.8 K/uL (ref 4.0–10.5)
nRBC: 0 % (ref 0.0–0.2)

## 2024-06-21 LAB — TROPONIN T, HIGH SENSITIVITY
Troponin T High Sensitivity: <15 ng/L (ref 0–19)
Troponin T High Sensitivity: <15 ng/L (ref 0–19)

## 2024-06-21 LAB — LIPASE, BLOOD: Lipase: 28 U/L (ref 11–51)

## 2024-06-21 LAB — CORTISOL: Cortisol, Plasma: 11.8 ug/dL

## 2024-06-21 MED ORDER — PANTOPRAZOLE SODIUM 20 MG PO TBEC: 40.0000 mg | DELAYED_RELEASE_TABLET | Freq: Every day | ORAL | 0 refills | Status: DC

## 2024-06-21 MED ORDER — ONDANSETRON HCL 4 MG/2ML IJ SOLN
4.0000 mg | Freq: Once | INTRAMUSCULAR | Status: AC
  Administered 2024-06-21: 4 mg via INTRAVENOUS
  Filled 2024-06-21: qty 2

## 2024-06-21 MED ORDER — DICYCLOMINE HCL 20 MG PO TABS: 20.0000 mg | ORAL_TABLET | Freq: Two times a day (BID) | ORAL | 0 refills | Status: DC

## 2024-06-21 MED ORDER — FENTANYL CITRATE PF 50 MCG/ML IJ SOSY
50.0000 ug | PREFILLED_SYRINGE | Freq: Once | INTRAMUSCULAR | Status: AC
  Administered 2024-06-21: 50 ug via INTRAVENOUS
  Filled 2024-06-21: qty 1

## 2024-06-21 MED ORDER — IOHEXOL 350 MG/ML SOLN
100.0000 mL | Freq: Once | INTRAVENOUS | Status: AC | PRN
  Administered 2024-06-21: 75 mL via INTRAVENOUS

## 2024-06-21 MED ORDER — HYDROMORPHONE HCL 1 MG/ML IJ SOLN
1.0000 mg | Freq: Once | INTRAMUSCULAR | Status: AC
  Administered 2024-06-21: 1 mg via INTRAVENOUS
  Filled 2024-06-21: qty 1

## 2024-06-21 MED ORDER — ONDANSETRON 4 MG PO TBDP: 4.0000 mg | ORAL_TABLET | Freq: Three times a day (TID) | ORAL | 0 refills | Status: AC | PRN

## 2024-06-21 NOTE — ED Triage Notes (Signed)
 Ambulatory to room. Complains of upper abdominal pain x 3 days. Pain  radiates to sides, back, and upper chest. SOB causing difficulty speaking. Dizziness. Cramps in toes

## 2024-06-21 NOTE — ED Provider Notes (Signed)
 [place] [facility] Provider Note   CSN: [id number] Arrival date & time: 06/21/24  1005     Patient presents with: Abdominal Pain   Sheryl Stone is a 68 y.o. female.   HPI     68yo female with history of hypertension, STEMI 2022, anxiety disorder with panic attacks who presents with concern for upper abdominal pain, chest pain and dyspnea.   Reports for the past 3 days she has had epigastric abdominal pain that is radiating to the chest and around towards her sides.  It has been constant, however waxing and waning.  It is worse when she sits up.  She cannot say it is necessarily worse when she eats or drinks.  Is not sure if it is exertional.  She is mostly just want to lay down for the last couple of days.  Reports associated nausea but denies vomiting.  Reports that she has had normal bowel movements without constipation or diarrhea.  Her abdomen has been more distended.  The pain is primarily epigastric going to the right upper quadrant, but does radiate around the sides towards her back.  The pain also goes to her chest and reports that she has had shortness of breath.  Reports that shortness of breath started 2 weeks ago symptoms began.  She had a history of prior stenting in 2022 and is not sure if this feels similar to that.  Denies any fevers, urinary symptoms, long trips car airplane.   Past Medical History:  Diagnosis Date   Anxiety and depression    Back pain    Fibromyalgia    Hypertension    Panic attacks     Prior to Admission medications   Medication Sig Start Date End Date Taking? Authorizing Provider  dicyclomine (BENTYL) 20 MG tablet Take 1 tablet (20 mg total) by mouth 2 (two) times daily. 06/21/24  Yes Dreama Longs, MD  ondansetron  (ZOFRAN -ODT) 4 MG disintegrating tablet Take 1 tablet (4 mg total) by mouth every 8 (eight) hours as needed for nausea or vomiting. 06/21/24  Yes Dreama Longs, MD  pantoprazole   (PROTONIX ) 20 MG tablet Take 2 tablets (40 mg total) by mouth daily for 14 days. 06/21/24 07/05/24 Yes Dreama Longs, MD  ALPRAZolam  (XANAX ) 0.5 MG tablet Take 1 tablet (0.5 mg total) by mouth 3 (three) times daily as needed for anxiety. 05/14/24   Cyndi Shaver, PA-C  aspirin 81 MG tablet Take 81 mg by mouth daily.    [provider]  atorvastatin (LIPITOR) 80 MG tablet Take 80 mg by mouth daily.    [provider]  Cholecalciferol ([employer] VIT D 5000 HIGH-POTENCY PO) Take by mouth.    [provider]  ezetimibe (ZETIA) 10 MG tablet Take 10 mg by mouth daily.    [provider]  lisinopril (ZESTRIL) 5 MG tablet Take 5 mg by mouth daily.    [provider]  metFORMIN  (GLUCOPHAGE -XR) 500 MG 24 hr tablet Take 1 tablet (500 mg total) by mouth daily with breakfast. For one week and then increase to 1000 mg ( 2 tablets) daily with breakfast 05/06/24   Drubel, Shaver, PA-C  metoprolol succinate (TOPROL-XL) 25 MG 24 hr tablet Take 25 mg by mouth daily.    [provider]  Multiple Vitamin (MULTIVITAMIN) tablet Take 1 tablet by mouth daily.    [provider]  senna-docusate (SENOKOT-S) 8.6-50 MG tablet Take 1 tablet by mouth at bedtime. 09/23/17   Mackuen, Courteney Lyn, MD  tiZANidine  (ZANAFLEX ) 4 MG tablet Take 1 tablet (4 mg total) by mouth every 8 (eight) hours as needed. 05/14/24   Cyndi Shaver, PA-C    Allergies: Latex, Codeine, Dextromethorphan, Diazepam, Diphenhydramine  hcl, Doxycycline, Elemental sulfur, Moxifloxacin, Nitrofurantoin, Penicillins, Propoxyphene, Pseudoephedrine, Sulfa antibiotics, Sulfamethoxazole, Sulfur, Tetracycline hcl, Tramadol, and Morphine    Review of Systems  Updated Vital Signs BP 105/71   Pulse 84   Temp 97.7 F (36.5 C)   Resp 19   SpO2 96%   Physical Exam Vitals and nursing note reviewed.  Constitutional:      General: She is not in acute distress.    Appearance: She is well-developed. She is  not diaphoretic.  HENT:     Head: Normocephalic and atraumatic.  Eyes:     Conjunctiva/sclera: Conjunctivae normal.  Cardiovascular:     Rate and Rhythm: Normal rate and regular rhythm.     Heart sounds: Normal heart sounds. No murmur heard.    No friction rub. No gallop.  Pulmonary:     Effort: Pulmonary effort is normal. No respiratory distress.     Breath sounds: Normal breath sounds. No wheezing or rales.  Abdominal:     General: There is no distension.     Palpations: Abdomen is soft.     Tenderness: There is generalized abdominal tenderness (generalized but worse epigastric) and tenderness in the epigastric area. There is no guarding.  Musculoskeletal:        General: No tenderness.     Cervical back: Normal range of motion.  Skin:    General: Skin is warm and dry.     Findings: No erythema or rash.  Neurological:     Mental Status: She is alert and oriented to person, place, and time.     (all labs ordered are listed, but only abnormal results are displayed) Labs Reviewed  COMPREHENSIVE METABOLIC PANEL WITH GFR - Abnormal; Notable for the following components:      Result Value   Sodium 130 (*)    Chloride 90 (*)    Glucose, Bld 130 (*)    BUN 7 (*)    All other components within normal limits  CBC - Abnormal; Notable for the following components:   Platelets 439 (*)    All other components within normal limits  RESP PANEL BY RT-PCR (RSV, FLU A&B, COVID)  RVPGX2  LIPASE, BLOOD  URINALYSIS, ROUTINE W REFLEX MICROSCOPIC  CORTISOL  TROPONIN T, HIGH SENSITIVITY  TROPONIN T, HIGH SENSITIVITY    EKG: EKG Interpretation Date/Time:  Sunday June 21 2024 10:16:06 EDT Ventricular Rate:  103 PR Interval:  191 QRS Duration:  89 QT Interval:  357 QTC Calculation: 468 R Axis:   42  Text Interpretation: Sinus tachycardia Abnormal R-wave progression, early transition Artifact, similar ECG to previous Confirmed by Dreama Longs ([id number]) on 06/21/2024 11:02:59  AM  Radiology: CT Angio Chest PE W and/or Wo Contrast Result Date: 06/21/2024 CLINICAL DATA:  Pulmonary embolism (PE) suspected, high prob Shortness of breath with upper abdominal pain for 3 days. EXAM: CT ANGIOGRAPHY CHEST WITH CONTRAST TECHNIQUE: Multidetector CT imaging of the chest was performed using the standard protocol during bolus administration of intravenous contrast. Multiplanar CT image reconstructions and MIPs were obtained to evaluate the vascular anatomy. RADIATION DOSE REDUCTION: This exam was performed according to the departmental dose-optimization program which includes automated exposure control, adjustment of the mA and/or kV according to patient size and/or use of iterative reconstruction technique. CONTRAST:  75mL OMNIPAQUE  IOHEXOL  350 MG/ML  SOLN COMPARISON:  CTA 04/24/2024, 12/19/2023 and 11/21/2005. FINDINGS: Cardiovascular: The pulmonary arteries are well opacified with contrast to the level of the segmental branches. There is no evidence of acute pulmonary embolism. Atherosclerosis of the aorta, great vessels and coronary arteries again noted. No acute systemic arterial abnormalities are identified. The heart size is normal. There is no pericardial effusion. Mediastinum/Nodes: There are no enlarged mediastinal, hilar or axillary lymph nodes. The thyroid gland, trachea and esophagus demonstrate no significant findings. Lungs/Pleura: No pleural effusion or pneumothorax. Pulmonary assessment mildly limited by breathing artifact. Scattered pulmonary scarring again noted. There are scattered pulmonary nodules, largest a solid nodule in the right lower lobe, measuring 10 mm on image 95/11. This nodule has not significantly changed in size compared with recent prior chest CTs, although demonstrates slow growth compared with an abdominal CT dated 09/23/2017 (7 mm). Other scattered smaller nodules are unchanged. There is no confluent airspace disease. Upper abdomen: No acute findings are  demonstrated in the upper abdomen status post cholecystectomy. Stable small left adrenal adenoma for which no specific follow-up imaging is recommended. Musculoskeletal/Chest wall: There is no chest wall mass or suspicious osseous finding. Review of the MIP images confirms the above findings. IMPRESSION: 1. No evidence of acute pulmonary embolism or other acute chest findings. 2. Scattered pulmonary nodules, largest a solid nodule in the right lower lobe measuring 10 mm. Although slowly growing compared with prior abdominal CT's dating back to 2018, this is stable from more recent chest CT's and likely benign. Continued chest CT follow-up suggested. 3. Stable small left adrenal adenoma for which no specific follow-up imaging is recommended. 4.  Aortic Atherosclerosis (ICD10-I70.0). Electronically Signed   By: Elsie Perone M.D.   On: 06/21/2024 12:12   CT ABDOMEN PELVIS W CONTRAST Result Date: 06/21/2024 EXAM: CT ABDOMEN AND PELVIS WITH CONTRAST 06/21/2024 11:23:24 AM TECHNIQUE: CT of the abdomen and pelvis was performed with the administration of 75 mL of iohexol  (OMNIPAQUE ) 350 MG/ML injection. Multiplanar reformatted images are provided for review. Automated exposure control, iterative reconstruction, and/or weight-based adjustment of the mA/kV was utilized to reduce the radiation dose to as low as reasonably achievable. COMPARISON: Comparison is made to prior studies dated 09/23/2017 and 04/24/2024. CLINICAL HISTORY: Bowel obstruction suspected. Ambulatory to room. Complains of upper abdominal pain x 3 days. Pain radiates to sides, back, and upper chest. SOB causing difficulty speaking. Dizziness. Cramps in toes. FINDINGS: LOWER CHEST: Stable right middle lobe lung nodule measuring 3 mm (image 21/5). 3 mm nodule is identified within the posterior left lower lobe, unchanged from previous exam. Stable scarring within both lung bases. Within the right lower lobe there is a lung nodule measuring 9 mm (on  09/23/2017 this nodule measured 8 mm). LIVER: The liver is unremarkable. GALLBLADDER AND BILE DUCTS: Status post cholecystectomy. Common bile duct measures up to 1 cm in diameter without signs of common bile duct stone. This is a stable finding compared with 04/24/2024. SPLEEN: No acute abnormality. PANCREAS: No acute abnormality. ADRENAL GLANDS: No acute abnormality. KIDNEYS, URETERS AND BLADDER: No stones in the kidneys or ureters. No hydronephrosis. No perinephric or periureteral stranding. Urinary bladder is unremarkable. GI AND BOWEL: Stomach demonstrates no acute abnormality. Distal colonic diverticulosis without signs of acute diverticulitis. There is no bowel obstruction. PERITONEUM AND RETROPERITONEUM: No ascites. No focal fluid collections. No free air. VASCULATURE: Aorta is normal in caliber. Aortic atherosclerotic calcification is identified. LYMPH NODES: No signs of abdominal or pelvic adenopathy. REPRODUCTIVE ORGANS: Uterus is surgically absent.  The right ovary has a cyst measuring 2.4 cm (image 71/2). This is unchanged from previous exam. BONES AND SOFT TISSUES: No acute or suspicious osseous findings. No focal soft tissue abnormality. IMPRESSION: 1. No CT evidence of bowel obstruction or other acute abnormality in the abdomen or pelvis to account for upper abdominal pain. 2. Distal colonic diverticulosis without signs of acute diverticulitis. 3. Common bile duct mildly dilated to 1 cm without evidence of choledocholithiasis; stable from prior. Favor post cholecystectomy physiology. 4. 2.4 cm right ovarian cyst. Simple-appearing. No follow-up imaging recommended. Electronically signed by: Waddell Calk MD 06/21/2024 12:03 PM EDT RP Workstation: [id number]   DG Chest 2 View Result Date: 06/21/2024 EXAM: 2 VIEW(S) XRAY OF THE CHEST 06/21/2024 10:33:00 AM COMPARISON: 04/24/2024 CLINICAL HISTORY: SOB. Ambulatory to room. Complains of upper abdominal pain x 3 days. Pain radiates to sides, back, and  upper chest. SOB causing difficulty speaking. Dizziness. Cramps in toes. Hx - HTN. FINDINGS: LUNGS AND PLEURA: Stable linear scarring in left mid to lower lung. No focal pulmonary opacity. No pulmonary edema. No pleural effusion. No pneumothorax. HEART AND MEDIASTINUM: Aortic atherosclerosis. No acute abnormality of the cardiac and mediastinal silhouettes. BONES AND SOFT TISSUES: No acute osseous abnormality. IMPRESSION: 1. No acute findings. Electronically signed by: Lonni Necessary MD 06/21/2024 11:00 AM EDT RP Workstation: [id number]     Procedures   Medications Ordered in the ED  fentaNYL  (SUBLIMAZE ) injection 50 mcg (50 mcg Intravenous Given 06/21/24 1102)  ondansetron  (ZOFRAN ) injection 4 mg (4 mg Intravenous Given 06/21/24 1101)  iohexol  (OMNIPAQUE ) 350 MG/ML injection 100 mL (75 mLs Intravenous Contrast Given 06/21/24 1108)  HYDROmorphone  (DILAUDID ) injection 1 mg (1 mg Intravenous Given 06/21/24 1207)                                     68yo female with history of hypertension, STEMI 2022, anxiety disorder with panic attacks who presents with concern for upper abdominal pain, chest pain and dyspnea.   Differential diagnosis for chest pain includes pulmonary embolus, dissection, pneumothorax, pneumonia, ACS, myocarditis, pericarditis, cholangitis, choledocholithiasis, pancreatitis, bowel obstruction, colitis.    EKG was done and evaluate by me and showed no acute ST changes and no signs of pericarditis.   Chest x-ray completed and personally by and interpreted by me shows no evidence of pneumonia, pneumothorax, or pulmonary edema, and no widened mediastinum.  Labs completed and personally about interpreted by me show no evidence of leukocytosis, no anemia, stable thrombocytosis, no transaminitis, mild hyponatremia similar to prior values, no signs of pancreatitis.  Troponin within normal limits.  No signs of urinary tract infection.  Given shortness of breath, chest pain, CT PE  study was ordered which showed no evidence of PE.  Does show scattered pulmonary nodules, stable to prior CTs dating back to 2018 and continued chest CT follow-up is recommended.  Stable small left adrenal adenoma with no specific follow-up imaging . CT abdomen pelvis ordered given presence of abdominal pain, tenderness, shows no sign of bowel obstruction or other acute abnormalities.  Her common bile duct is mildly dilated to 1 cm without evidence of choledocholithiasis and is stable from prior.  Simple appearing ovarian cyst.  Given CMP with normal transaminases, normal bilirubin, and, bile duct similar to prior, have low suspicion at this time for biliary obstruction.  Given abdominal pain/tenderness present, have low suspicion for ACS.   Has seen GI in the past  with [facility], will give number to GI on call to use as desired for follow up abdominal pain. Recommend continued outpatient evaluation of symptoms. Given PPI rx for possible gastritis, bentyl for pain, zofran  for nausea.  Given history of CAD, presence of chest pain, recommend follow up with Cardiology as well.  Patient discharged in stable condition with understanding of reasons to return.       Final diagnoses:  Epigastric pain  Dyspnea, unspecified type  Chest pain, unspecified type    ED Discharge Orders          Ordered    dicyclomine (BENTYL) 20 MG tablet  2 times daily        06/21/24 1305    pantoprazole  (PROTONIX ) 20 MG tablet  Daily        06/21/24 1305    ondansetron  (ZOFRAN -ODT) 4 MG disintegrating tablet  Every 8 hours PRN        06/21/24 1305    Ambulatory referral to Cardiology        06/21/24 1305               Dreama Longs, MD 06/21/24 2220

## 2024-06-22 ENCOUNTER — Other Ambulatory Visit: Payer: Self-pay | Admitting: Family Medicine

## 2024-06-22 DIAGNOSIS — F419 Anxiety disorder, unspecified: Secondary | ICD-10-CM

## 2024-06-22 MED ORDER — ALPRAZOLAM 0.5 MG PO TABS: 0.5000 mg | ORAL_TABLET | Freq: Three times a day (TID) | ORAL | 0 refills | Status: DC | PRN

## 2024-06-22 NOTE — Telephone Encounter (Unsigned)
 Copied from CRM #[id number]. Topic: Clinical - Medication Refill >> Jun 22, 2024  9:17 AM Franky GRADE wrote: Medication: ALPRAZolam  (XANAX ) 0.5 MG tablet [[id number]]  Has the patient contacted their pharmacy? No (Agent: If no, request that the patient contact the pharmacy for the refill. If patient does not wish to contact the pharmacy document the reason why and proceed with request.) (Agent: If yes, when and what did the pharmacy advise?)  This is the patient's preferred pharmacy:  [facility] [address], [place] - [address] [place] [address] Phone: [phone] Fax: [phone]  Is this the correct pharmacy for this prescription? Yes If no, delete pharmacy and type the correct one.   Has the prescription been filled recently? No  Is the patient out of the medication? No  Has the patient been seen for an appointment in the last year OR does the patient have an upcoming appointment? Yes  Can we respond through MyChart? Yes  Agent: Please be advised that Rx refills may take up to 3 business days. We ask that you follow-up with your pharmacy.

## 2024-06-29 ENCOUNTER — Telehealth: Payer: Self-pay | Admitting: Physician Assistant

## 2024-06-29 ENCOUNTER — Other Ambulatory Visit: Payer: Self-pay | Admitting: Physician Assistant

## 2024-06-29 ENCOUNTER — Other Ambulatory Visit: Payer: Self-pay | Admitting: Family Medicine

## 2024-06-29 ENCOUNTER — Ambulatory Visit: Payer: Self-pay

## 2024-06-29 MED ORDER — DICYCLOMINE HCL 20 MG PO TABS: 20.0000 mg | ORAL_TABLET | Freq: Two times a day (BID) | ORAL | 0 refills | Status: DC

## 2024-06-29 MED ORDER — PANTOPRAZOLE SODIUM 20 MG PO TBEC: 40.0000 mg | DELAYED_RELEASE_TABLET | Freq: Every day | ORAL | 0 refills | Status: DC

## 2024-06-29 NOTE — Telephone Encounter (Signed)
 Appt scheduled

## 2024-06-29 NOTE — Telephone Encounter (Signed)
 Copied from CRM #[id number]. Topic: Clinical - Medication Refill >> Jun 29, 2024  9:44 AM Ahlexyia S wrote: Medication:  dicyclomine (BENTYL) 20 MG tablet pantoprazole  (PROTONIX ) 20 MG tablet  Has the patient contacted their pharmacy? Yes, pt was told there was no refills and to contact PCP. (Agent: If no, request that the patient contact the pharmacy for the refill. If patient does not wish to contact the pharmacy document the reason why and proceed with request.) (Agent: If yes, when and what did the pharmacy advise?)  This is the patient's preferred pharmacy:  [facility] [address] [place] - [address] [place] [address] Phone: [phone] Fax: [phone]  Is this the correct pharmacy for this prescription? Yes If no, delete pharmacy and type the correct one.   Has the prescription been filled recently? Yes  Is the patient out of the medication? No, pt has limited supply for medication dicyclomine (BENTYL) 20 MG tablet.  Has the patient been seen for an appointment in the last year OR does the patient have an upcoming appointment? Yes  Can we respond through MyChart? No, pt prefers phone calls.  Agent: Please be advised that Rx refills may take up to 3 business days. We ask that you follow-up with your pharmacy.

## 2024-06-29 NOTE — Telephone Encounter (Signed)
 Copied from CRM [id number]. Topic: Appointments - Scheduling Inquiry for Clinic >> Jun 29, 2024  9:56 AM Ahlexyia S wrote: Reason for CRM: Pt called in for medication refills that were prescribed to her during her hospital visit. I informed pt that I will send over request but due to her recently being discharged she is needing to come in for a hospital follow up to discuss her recent hospital visit. When I informed pt she stated she has never heard of that and does not want to come in for multiple visits considering she has one this upcoming Monday which is Oct 13th. I let pt know that the upcoming appointment is a TOC and the hospital follow up needs to be a separate appointment. Pt stated she is not going to come in for two different visits when it can be done in one. Let pt know that I will make clinic aware and they will contact her via phone if need be.

## 2024-06-29 NOTE — Telephone Encounter (Signed)
 Spoke to pt, she is fine with keeping her appt on 10/13 with Waddell and the one on 10/27 with [place]. She states that she was told that she needed a third appt for a hospital follow up from an ED visit on 9/28. I made her aware that she would be ok to wait and see Waddell on 10/13 that if she needed us  sooner to call back.

## 2024-06-29 NOTE — Telephone Encounter (Signed)
 Not sure what to do with this

## 2024-06-29 NOTE — Telephone Encounter (Signed)
       FYI Only or Action Required?: Action required by provider: medication refill request.  Patient was last seen in primary care on 05/05/2024 by Cyndi Shaver, PA-C.  Called Nurse Triage reporting Medication Problem.   Triage Disposition: Call PCP Now  Patient/caregiver understands and will follow disposition?: Yes   Copied from CRM [id number]. Topic: Clinical - Red Word Triage >> Jun 29, 2024  3:46 PM Frederich PARAS wrote: [facility] that prompted transfer to Nurse Triage: anxiety  Out of med and experiencing red word symptoms anxiety. Pt called and asked about her meds I adv pt I see where the request was sent this am . Pt says she have called the pharmacy they donot have it. I adv her of the 3 business day policy. Pt says she gets panic attacks and her anxiety is bothering her due to all of this back in forth with her prescription. Reason for Disposition  [1] Caller has URGENT medicine question about med that primary care doctor (or NP/PA) or specialist prescribed AND [2] triager unable to answer question  Answer Assessment - Initial Assessment Questions 1. NAME of MEDICINE: What medicine(s) are you calling about?     dicyclomine (BENTYL) 20 MG tablet pantoprazole  (PROTONIX ) 20 MG tablet  2. QUESTION: What is your question? (e.g., double dose of medicine, side effect)     Medication refill request  3. PRESCRIBER: Who prescribed the medicine? Reason: if prescribed by specialist, call should be referred to that group.     PCP  Protocols used: Medication Question Call-A-AH

## 2024-07-03 DIAGNOSIS — E785 Hyperlipidemia, unspecified: Secondary | ICD-10-CM | POA: Insufficient documentation

## 2024-07-03 DIAGNOSIS — M797 Fibromyalgia: Secondary | ICD-10-CM | POA: Insufficient documentation

## 2024-07-03 DIAGNOSIS — J302 Other seasonal allergic rhinitis: Secondary | ICD-10-CM | POA: Insufficient documentation

## 2024-07-03 DIAGNOSIS — I219 Acute myocardial infarction, unspecified: Secondary | ICD-10-CM | POA: Insufficient documentation

## 2024-07-03 DIAGNOSIS — K589 Irritable bowel syndrome without diarrhea: Secondary | ICD-10-CM | POA: Insufficient documentation

## 2024-07-06 ENCOUNTER — Encounter: Payer: Self-pay | Admitting: Family Medicine

## 2024-07-06 ENCOUNTER — Ambulatory Visit: Admitting: Family Medicine

## 2024-07-06 VITALS — BP 124/88 | HR 96 | Temp 98.2°F | Ht 65.0 in | Wt 150.0 lb

## 2024-07-06 DIAGNOSIS — J0140 Acute pansinusitis, unspecified: Secondary | ICD-10-CM

## 2024-07-06 DIAGNOSIS — I10 Essential (primary) hypertension: Secondary | ICD-10-CM | POA: Diagnosis not present

## 2024-07-06 DIAGNOSIS — I2510 Atherosclerotic heart disease of native coronary artery without angina pectoris: Secondary | ICD-10-CM

## 2024-07-06 DIAGNOSIS — K589 Irritable bowel syndrome without diarrhea: Secondary | ICD-10-CM

## 2024-07-06 DIAGNOSIS — K219 Gastro-esophageal reflux disease without esophagitis: Secondary | ICD-10-CM

## 2024-07-06 DIAGNOSIS — F419 Anxiety disorder, unspecified: Secondary | ICD-10-CM

## 2024-07-06 DIAGNOSIS — Z7984 Long term (current) use of oral hypoglycemic drugs: Secondary | ICD-10-CM

## 2024-07-06 DIAGNOSIS — E1165 Type 2 diabetes mellitus with hyperglycemia: Secondary | ICD-10-CM

## 2024-07-06 DIAGNOSIS — Z0000 Encounter for general adult medical examination without abnormal findings: Secondary | ICD-10-CM

## 2024-07-06 DIAGNOSIS — M797 Fibromyalgia: Secondary | ICD-10-CM

## 2024-07-06 DIAGNOSIS — E785 Hyperlipidemia, unspecified: Secondary | ICD-10-CM

## 2024-07-06 LAB — MICROALBUMIN / CREATININE URINE RATIO
Creatinine,U: 94.6 mg/dL
Microalb Creat Ratio: 36.5 mg/g — ABNORMAL HIGH (ref 0.0–30.0)
Microalb, Ur: 3.5 mg/dL — ABNORMAL HIGH (ref 0.0–1.9)

## 2024-07-06 MED ORDER — FLUCONAZOLE 150 MG PO TABS: 150.0000 mg | ORAL_TABLET | Freq: Every day | ORAL | 0 refills | Status: DC

## 2024-07-06 MED ORDER — GABAPENTIN 100 MG PO CAPS
100.0000 mg | ORAL_CAPSULE | Freq: Three times a day (TID) | ORAL | 3 refills | Status: AC
Start: 2024-07-06 — End: ?

## 2024-07-06 MED ORDER — BUSPIRONE HCL 5 MG PO TABS: 5.0000 mg | ORAL_TABLET | Freq: Two times a day (BID) | ORAL | 3 refills | Status: DC

## 2024-07-06 MED ORDER — AZITHROMYCIN 250 MG PO TABS: ORAL_TABLET | ORAL | 0 refills | Status: AC

## 2024-07-06 MED ORDER — PANTOPRAZOLE SODIUM 20 MG PO TBEC: 40.0000 mg | DELAYED_RELEASE_TABLET | Freq: Every day | ORAL | 0 refills | Status: DC

## 2024-07-06 NOTE — Assessment & Plan Note (Signed)
 Hypertension well-controlled on current regimen. Blood pressure readings are stable. - Continue current antihypertensive medications.

## 2024-07-06 NOTE — Assessment & Plan Note (Signed)
 Chronic anxiety and depression with long-term use of Xanax . Discussed risks of benzodiazepines. Explored options for psychiatric referral and counseling. Discussed gradual tapering of Xanax . - Start Buspar for anxiety management. - Gradually taper Xanax  dosage. Try reducing by 1/2 tablet every 1-2 weeks.  - Refer to psychiatry for further management. - Encourage counseling for anxiety and depression.

## 2024-07-06 NOTE — Assessment & Plan Note (Signed)
 Chronic pain associated with fibromyalgia. Current pain management with Tylenol  is insufficient. Discussed potential benefits of gabapentin. - Start gabapentin 100 mg up to three times a day, monitor for side effects. - Consider referral to pain management if needed.

## 2024-07-06 NOTE — Assessment & Plan Note (Signed)
 GERD managed with Protonix . Recent need for refill. - Refill Protonix  prescription.

## 2024-07-06 NOTE — Assessment & Plan Note (Signed)
-   Continue Bentyl and Protonix . - Refer to GI specialist at preferred location for follow-up.

## 2024-07-06 NOTE — Assessment & Plan Note (Signed)
 History of myocardial infarction in 2022. On atorvastatin and Zetia for cholesterol management. Regular cardiology follow-up needed. - Continue atorvastatin and Zetia. - Refer to cardiologist at preferred location for follow-up.

## 2024-07-06 NOTE — Assessment & Plan Note (Signed)
 Type 2 diabetes with last A1c of 7% (too early to recheck today). On metformin  1000 mg daily.  - Continue metformin  1000 mg daily. - Provide dietary education materials for diabetes management. - Encourage regular blood sugar monitoring. - Order diabetic eye exam referral. - Obtain urine sample to check kidney proteins.

## 2024-07-06 NOTE — Assessment & Plan Note (Signed)
 Currently on Lipitor and Zetia.  Lifestyle factors for lowering cholesterol include: Diet therapy - heart-healthy diet rich in fruits, veggies, fiber-rich whole grains, lean meats, chicken, fish (at least twice a week), fat-free or 1% dairy products; foods low in saturated/trans fats, cholesterol, sodium, and sugar. Mediterranean diet has shown to be very heart healthy. Regular exercise - recommend at least 30 minutes a day, 5 times per week Weight management

## 2024-07-06 NOTE — Patient Instructions (Addendum)
  VISIT SUMMARY: Today, we discussed your ongoing health concerns and made adjustments to your treatment plan. We addressed your sinus symptoms, diabetes management, hypertension, cholesterol, chronic pain, anxiety, depression, gastrointestinal issues, and other health maintenance needs.  YOUR PLAN: -TYPE 2 DIABETES MELLITUS: Type 2 diabetes is a condition where your body does not use insulin properly, leading to high blood sugar levels. Continue taking metformin  1000 mg daily. We provided dietary education materials and encourage regular blood sugar monitoring. We will also refer you for a diabetic eye exam and check your urine for kidney proteins.  -ESSENTIAL HYPERTENSION: Hypertension is high blood pressure. Your blood pressure is well-controlled with your current medications, so continue taking lisinopril 5 mg and metoprolol 25 mg daily.  -HYPERLIPIDEMIA AND HISTORY OF MYOCARDIAL INFARCTION: Hyperlipidemia is high cholesterol, which can lead to heart disease. Continue taking atorvastatin 80 mg and Zetia 10 mg daily. We will refer you to a cardiologist for regular follow-up.  -FIBROMYALGIA WITH CHRONIC PAIN: Fibromyalgia is a condition that causes widespread pain. We will start you on gabapentin 100 mg up to three times a day and monitor for side effects. If needed, we may refer you to a pain management specialist.  -CHRONIC ANXIETY AND DEPRESSION: Chronic anxiety and depression are long-term mental health conditions. We will start you on Buspar and gradually taper your Xanax  dosage. We will refer you to psychiatry for further management and encourage counseling.  -IRRITABLE BOWEL SYNDROME: Continue taking Bentyl and Protonix . We will refer you to a GI specialist for follow-up.  -GASTROESOPHAGEAL REFLUX DISEASE: GERD is a condition where stomach acid frequently flows back into the tube connecting your mouth and stomach. Continue taking Protonix , and we have refilled your prescription.  -ACUTE  SINUSITIS: We prescribed azithromycin for your sinusitis and provided Diflucan to take if you develop a yeast infection after completing the antibiotic. Please do not take these two medications at the same time due to potential interactions.   -PULMONARY NODULES: Pulmonary nodules are small growths in the lungs. Given your history of smoking, we will need to keep an eye on this. Plan on annual CT screening.   -IRON DEFICIENCY, MILD: Iron deficiency means you have low levels of iron in your blood. Continue taking your multivitamin with iron and try to eat more iron-rich foods.   INSTRUCTIONS: Please follow up with the referrals provided: diabetic eye exam, cardiologist, psychiatry, and GI specialist. Continue monitoring your blood sugar and blood pressure at home. If you have any new or worsening symptoms, please contact our office.                      Contains text generated by [employer]bridge.                                 Contains text generated by Abridge.

## 2024-07-06 NOTE — Progress Notes (Signed)
 Established Patient Office Visit  Subjective   Patient ID: Sheryl Stone, female    DOB: [birth year]  Age: 68 y.o. MRN: [id number]  Chief Complaint  Patient presents with   Establish Care    HPI   Discussed the use of AI scribe software for clinical note transcription with the patient, who gave verbal consent to proceed.  History of Present Illness Sheryl Stone is a 68 year old female who presents for medication management and evaluation of sinus symptoms.  She has been experiencing sinus pressure, sneezing, and a fever of 100.13F intermittently, along with wheezing. These symptoms have persisted for over a week, affecting her sleep and causing discomfort.  Her hypertension is managed with lisinopril 5 mg daily and metoprolol 25 mg daily. She monitors her blood pressure at home, which is generally stable. She reports walking for exercise.  Her hyperlipidemia is managed with atorvastatin 80 mg and Zetia 10 mg daily. She takes a daily baby aspirin for cardiovascular protection.  She has type 2 diabetes, with a last A1c of 7% in August. She is on metformin , currently taking 1000 mg in the morning. Her husband assists with monitoring her blood sugar, which has been as high as 198 mg/dL in the morning.  She has a history of myocardial infarction in 2022. She does not currently see a cardiologist regularly.  She reports chronic pain attributed to fibromyalgia and colitis. She takes Bentyl for IBS and Protonix  for reflux. She experiences widespread pain. She previously tried gabapentin but does not recall the outcome.  She has a history of anxiety and depression, managed with Xanax  three times daily. She is dissatisfied with Xanax  and is interested in exploring other options. She has experienced significant personal loss, including the deaths of her mother, daughter, and son, contributing to her mental health challenges.  She has a history of smoking, having quit 8-9 years ago.  She has pulmonary nodules, the largest being 10 mm, which are monitored periodically. She also has a stable ovarian cyst and a history of low iron, managed with a multivitamin containing iron.  She takes vitamin D 500 mg, though she is unsure of the last time her levels were checked. She also takes a stool softener and has a history of using tizanidine , which she discontinued due to ineffectiveness.     Hypertension, HLD, CAD (MI 2022 with stent): - Medications: atorvastatin 80 mg daily, aspirin 81 mg, Zetia 10 mg daily, metoprolol XL 25 mg daily, lisinopril 5 mg daily - Compliance: good - Checking BP at home: usually good - Denies any SOB, recurrent headaches, CP, vision changes, LE edema, dizziness, palpitations, or medication side effects. - Diet: general  - Exercise: walking   Diabetes: - Checking glucose at home: rarely - Medications: metformin  1,000 mg daily - Compliance: good - Eye exam: referral placed - Foot exam:  - Microalbumin: today - Denies symptoms of hypoglycemia, polyuria, polydipsia, numbness extremities, foot ulcers/trauma, wounds that are not healing, medication side effects  Lab Results  Component Value Date   HGBA1C 7.0 (H) 05/05/2024    Mood follow-up: - Diagnosis: anxiety - Treatment: Xanax  0.5 mg TID PRN,  - Medication side effects: none - SI/HI: no - Update: Still having a lot of anxiety, asking to change to Klonopin.    GERD, IBS: - management: Bentyl BID, Protonix  20 mg daily - symptoms:   Fibromyalgia: - tylenol  not helping - will check with insurance about finding a pain clinic, not sure she wants another doctor  right now            ROS All review of systems negative except what is listed in the HPI    Objective:     BP 124/88   Pulse 96   Temp 98.2 F (36.8 C) (Oral)   Ht 5' 5 (1.651 m)   Wt 150 lb (68 kg)   SpO2 96%   BMI 24.96 kg/m    Physical Exam Vitals reviewed.  Constitutional:      Appearance: Normal  appearance.  Cardiovascular:     Rate and Rhythm: Normal rate and regular rhythm.  Pulmonary:     Effort: Pulmonary effort is normal.     Breath sounds: Normal breath sounds.  Skin:    General: Skin is warm and dry.  Neurological:     Mental Status: She is alert and oriented to person, place, and time.  Psychiatric:        Mood and Affect: Mood normal.        Behavior: Behavior normal.        Thought Content: Thought content normal.        Judgment: Judgment normal.      Results for orders placed or performed in visit on 07/06/24  Microalbumin / creatinine urine ratio  Result Value Ref Range   Microalb, Ur 3.5 (H) 0.0 - 1.9 mg/dL   Creatinine,U 05.3 mg/dL   Microalb Creat Ratio 36.5 (H) 0.0 - 30.0 mg/g      The ASCVD Risk score (Arnett DK, et al., 2019) failed to calculate for the following reasons:   Risk score cannot be calculated because patient has a medical history suggesting prior/existing ASCVD    Assessment & Plan:   Problem List Items Addressed This Visit       Active Problems   CAD (coronary artery disease)   History of myocardial infarction in 2022. On atorvastatin and Zetia for cholesterol management. Regular cardiology follow-up needed. - Continue atorvastatin and Zetia. - Refer to cardiologist at preferred location for follow-up.      Relevant Orders   Ambulatory referral to Cardiology   Hypertension   Hypertension well-controlled on current regimen. Blood pressure readings are stable. - Continue current antihypertensive medications.      Anxiety disorder with panic attacks   Chronic anxiety and depression with long-term use of Xanax . Discussed risks of benzodiazepines. Explored options for psychiatric referral and counseling. Discussed gradual tapering of Xanax . - Start Buspar for anxiety management. - Gradually taper Xanax  dosage. Try reducing by 1/2 tablet every 1-2 weeks.  - Refer to psychiatry for further management. - Encourage counseling  for anxiety and depression.      Relevant Medications   busPIRone (BUSPAR) 5 MG tablet   Other Relevant Orders   Ambulatory referral to Behavioral Health   Ambulatory referral to Psychiatry   GERD (gastroesophageal reflux disease)   GERD managed with Protonix . Recent need for refill. - Refill Protonix  prescription.      Relevant Medications   pantoprazole  (PROTONIX ) 20 MG tablet   Other Relevant Orders   Ambulatory referral to Gastroenterology   Type 2 diabetes mellitus with hyperglycemia, without long-term current use of insulin (HCC) - Primary   Type 2 diabetes with last A1c of 7% (too early to recheck today). On metformin  1000 mg daily.  - Continue metformin  1000 mg daily. - Provide dietary education materials for diabetes management. - Encourage regular blood sugar monitoring. - Order diabetic eye exam referral. - Obtain urine sample to check  kidney proteins.      Relevant Orders   Microalbumin / creatinine urine ratio (Completed)   Ambulatory referral to Ophthalmology   Fibromyalgia   Chronic pain associated with fibromyalgia. Current pain management with Tylenol  is insufficient. Discussed potential benefits of gabapentin. - Start gabapentin 100 mg up to three times a day, monitor for side effects. - Consider referral to pain management if needed.      Relevant Medications   gabapentin (NEURONTIN) 100 MG capsule   Hyperlipidemia   Currently on Lipitor and Zetia.  Lifestyle factors for lowering cholesterol include: Diet therapy - heart-healthy diet rich in fruits, veggies, fiber-rich whole grains, lean meats, chicken, fish (at least twice a week), fat-free or 1% dairy products; foods low in saturated/trans fats, cholesterol, sodium, and sugar. Mediterranean diet has shown to be very heart healthy. Regular exercise - recommend at least 30 minutes a day, 5 times per week Weight management        Irritable bowel syndrome   - Continue Bentyl and Protonix . - Refer to GI  specialist at preferred location for follow-up.      Relevant Medications   pantoprazole  (PROTONIX ) 20 MG tablet   Other Relevant Orders   Ambulatory referral to Gastroenterology   Other Visit Diagnoses       Encounter for medical examination to establish care          Acute non-recurrent pansinusitis     8-10 days of symptoms. States she usually needs a Zpak to take care of it. Has tolerated well in the past. Continue supportive measures including rest, hydration, humidifier use, steam showers, warm compresses to sinuses, warm liquids with lemon and honey, and over-the-counter cough, cold, and analgesics as needed.   Reports she may need Diflucan after completing ABX - advised not to take with Zpak for potential interactions, wait to see if symptoms develop after completing ABX.   Relevant Medications   azithromycin (ZITHROMAX) 250 MG tablet   fluconazole (DIFLUCAN) 150 MG tablet        Return in about 3 months (around 10/06/2024) for chronic disease management.    Waddell KATHEE Mon, NP

## 2024-07-07 ENCOUNTER — Ambulatory Visit: Payer: Self-pay | Admitting: Family Medicine

## 2024-07-09 ENCOUNTER — Other Ambulatory Visit: Payer: Self-pay | Admitting: Family

## 2024-07-10 ENCOUNTER — Telehealth: Payer: Self-pay | Admitting: Cardiology

## 2024-07-10 ENCOUNTER — Other Ambulatory Visit: Payer: Self-pay | Admitting: Family Medicine

## 2024-07-10 MED ORDER — DICYCLOMINE HCL 20 MG PO TABS: 20.0000 mg | ORAL_TABLET | Freq: Two times a day (BID) | ORAL | 0 refills | Status: DC

## 2024-07-10 NOTE — Telephone Encounter (Signed)
 Patient is requesting pre-registration paperwork be mailed to her so she can complete this prior to her visit.

## 2024-07-10 NOTE — Telephone Encounter (Signed)
 Rx resent.

## 2024-07-10 NOTE — Telephone Encounter (Signed)
 Copied from CRM #[id number]. Topic: Clinical - Medication Refill >> Jul 10, 2024  2:25 PM Leah C wrote: Medication: dicyclomine (BENTYL) 20 MG tablet  Has the patient contacted their pharmacy? Yes and pharmacy did not receivea refill request.  (Agent: If no, request that the patient contact the pharmacy for the refill. If patient does not wish to contact the pharmacy document the reason why and proceed with request.) (Agent: If yes, when and what did the pharmacy advise?)  This is the patient's preferred pharmacy:  [facility] [address], [place] - [address] [place] [address] Phone: [phone] Fax: [phone]  Is this the correct pharmacy for this prescription? Yes If no, delete pharmacy and type the correct one.   Has the prescription been filled recently? No  Is the patient out of the medication? Yes and needs expedite if possible   Has the patient been seen for an appointment in the last year OR does the patient have an upcoming appointment? yes  Can we respond through MyChart? yes  Agent: Please be advised that Rx refills may take up to 3 business days. We ask that you follow-up with your pharmacy.

## 2024-07-18 ENCOUNTER — Other Ambulatory Visit: Payer: Self-pay | Admitting: Family Medicine

## 2024-07-20 ENCOUNTER — Ambulatory Visit

## 2024-07-24 ENCOUNTER — Ambulatory Visit: Payer: Self-pay

## 2024-07-24 ENCOUNTER — Other Ambulatory Visit: Payer: Self-pay | Admitting: Family Medicine

## 2024-07-24 DIAGNOSIS — F419 Anxiety disorder, unspecified: Secondary | ICD-10-CM

## 2024-07-24 MED ORDER — BUSPIRONE HCL 5 MG PO TABS: 7.5000 mg | ORAL_TABLET | Freq: Two times a day (BID) | ORAL | 1 refills | Status: DC

## 2024-07-24 MED ORDER — METOPROLOL SUCCINATE ER 25 MG PO TB24: 25.0000 mg | ORAL_TABLET | Freq: Every day | ORAL | 1 refills | Status: AC

## 2024-07-24 NOTE — Telephone Encounter (Addendum)
 FYI Only or Action Required?: Action required by provider: Pt would like rx for Xanax  - new medication is not working. Also would like refill of metoprolol.  Patient was last seen in primary care on 07/06/2024 by Almarie Waddell NOVAK, NP.  Called Nurse Triage reporting Panic Attack.  Symptoms began ongoing for years.  Interventions attempted: Prescription medications: Buspar.  Symptoms are: gradually worsening.  Triage Disposition: See PCP When Office is Open (Within 3 Days)  Patient/caregiver understands and will follow disposition?: No, refuses disposition Pt states she has not transportation and cannot do a VV. She does not have needed equipment. Please advise. Pt also refuses BH and Mental health referrals.              Copied from CRM [id number]. Topic: Clinical - Red Word Triage >> Jul 24, 2024 10:02 AM Ashley SAUNDERS wrote: [facility] that prompted transfer to Nurse Triage: severe anxiety.    ----------------------------------------------------------------------- From previous Reason for Contact - Medication Question: Reason for CRM: busPIRone (BUSPAR) 5 MG tablet - not even in between 2 doses. Not covering the anxiety. Advice on a switch of alternative. Cry at any moment, November stressful (lost both children during the month in past years. Severe anxiety, panic attacks, trying to get of Xanax  Reason for Disposition  MODERATE anxiety (e.g., persistent or frequent anxiety symptoms; interferes with sleep, school, or work)  Answer Assessment - Initial Assessment Questions 1. CONCERN: Did anything happen that prompted you to call today?      Medication not working 2. ANXIETY SYMPTOMS: Can you describe how you (your loved one; patient) have been feeling? (e.g., tense, restless, panicky, anxious, keyed up, overwhelmed, sense of impending doom).      anxiety 3. ONSET: How long have you been feeling this way? (e.g., hours, days, weeks)     A long time years 4. SEVERITY: How  would you rate the level of anxiety? (e.g., 0 - 10; or mild, moderate, severe).     moderate 5. FUNCTIONAL IMPAIRMENT: How have these feelings affected your ability to do daily activities? Have you had more difficulty than usual doing your normal daily activities? (e.g., getting better, same, worse; self-care, school, work, interactions)     Is functional 6. HISTORY: Have you felt this way before? Have you ever been diagnosed with an anxiety problem in the past? (e.g., generalized anxiety disorder, panic attacks, PTSD). If Yes, ask: How was this problem treated? (e.g., medicines, counseling, etc.)     A long time  7. RISK OF HARM - SUICIDAL IDEATION: Do you ever have thoughts of hurting or killing yourself? If Yes, ask:  Do you have these feelings now? Do you have a plan on how you would do this?     no 8. TREATMENT:  What has been done so far to treat this anxiety? (e.g., medicines, relaxation strategies). What has helped?     Was on xanax  9. THERAPIST: Do you have a counselor or therapist? If Yes, ask: What is their name?     no  11. PATIENT SUPPORT: Who is with you now? Who do you live with? Do you have family or friends who you can talk to?        friend 28. OTHER SYMPTOMS: Do you have any other symptoms? (e.g., feeling depressed, trouble concentrating, trouble sleeping, trouble breathing, palpitations or fast heartbeat, chest pain, sweating, nausea, or diarrhea)       Sick to stomach, tired  - does  Protocols used: Anxiety and Panic  Attack-A-AH

## 2024-07-24 NOTE — Addendum Note (Signed)
 Addended by: Jemima Petko D on: 07/24/2024 12:11 PM   Modules accepted: Orders

## 2024-07-24 NOTE — Telephone Encounter (Signed)
 Copied from CRM [id number]. Topic: Clinical - Medication Refill >> Jul 24, 2024  9:57 AM Ashley R wrote: Medication:  metoprolol succinate (TOPROL-XL) 25 MG 24 hr tablet   Has the patient contacted their pharmacy? Yes, no refills   This is the patient's preferred pharmacy:  [facility] [address] [place] - [address] [place] [address] Phone: [phone] Fax: [phone]  Is this the correct pharmacy for this prescription? Yes   Has the prescription been filled recently? Yes  Is the patient out of the medication? A few left  Has the patient been seen for an appointment in the last year OR does the patient have an upcoming appointment? Yes  Can we respond through MyChart? No, callback [phone]  Agent: Please be advised that Rx refills may take up to 3 business days. We ask that you follow-up with your pharmacy.

## 2024-07-24 NOTE — Telephone Encounter (Addendum)
 Spoke w/ Pt- informed of recommedations. Pt states she has decided not to see psychiatry. Will try the increase in buspar. Rx faxed to [facility].

## 2024-07-24 NOTE — Telephone Encounter (Signed)
 I already placed a psych referral because I do not like to prescribe long-term benzos. At last visit with me she said the xanax  wasn't working so she was tapering off of it. She needs to see psych. She can try increasing the buspar gradually - start with 1.5 tablets twice a day for now.

## 2024-08-04 ENCOUNTER — Other Ambulatory Visit: Payer: Self-pay | Admitting: Family Medicine

## 2024-08-04 DIAGNOSIS — K219 Gastro-esophageal reflux disease without esophagitis: Secondary | ICD-10-CM

## 2024-08-04 NOTE — Telephone Encounter (Signed)
 Please review, looks like 14 day RX but last notes states managed with Protonix .

## 2024-08-04 NOTE — Telephone Encounter (Unsigned)
 Copied from CRM [id number]. Topic: Clinical - Medication Refill >> Aug 04, 2024  9:20 AM Sheryl Stone wrote: Medication:  pantoprazole  (PROTONIX ) 20 MG tablet  Has the patient contacted their pharmacy? Yes (Agent: If no, request that the patient contact the pharmacy for the refill. If patient does not wish to contact the pharmacy document the reason why and proceed with request.) (Agent: If yes, when and what did the pharmacy advise?)  This is the patient's preferred pharmacy:  [facility] [address], [place] - [address] [place] [address] Phone: [phone] Fax: [phone]  Is this the correct pharmacy for this prescription? Yes If no, delete pharmacy and type the correct one.   Has the prescription been filled recently? Yes  Is the patient out of the medication? No  Has the patient been seen for an appointment in the last year OR does the patient have an upcoming appointment? Yes  Can we respond through MyChart? No **Requested Callback**  Agent: Please be advised that Rx refills may take up to 3 business days. We ask that you follow-up with your pharmacy.

## 2024-08-10 ENCOUNTER — Other Ambulatory Visit: Payer: Self-pay

## 2024-08-10 DIAGNOSIS — E1165 Type 2 diabetes mellitus with hyperglycemia: Secondary | ICD-10-CM

## 2024-08-10 MED ORDER — METFORMIN HCL ER 500 MG PO TB24: 1000.0000 mg | ORAL_TABLET | Freq: Every day | ORAL | 1 refills | Status: AC

## 2024-08-11 ENCOUNTER — Other Ambulatory Visit: Payer: Self-pay | Admitting: Family Medicine

## 2024-08-11 NOTE — Telephone Encounter (Unsigned)
 Copied from CRM [id number]. Topic: Clinical - Medication Refill >> Aug 11, 2024  4:36 PM Sasha M wrote: Medication: ezetimibe (ZETIA) 10 MG tablet  Has the patient contacted their pharmacy? Yes (Agent: If no, request that the patient contact the pharmacy for the refill. If patient does not wish to contact the pharmacy document the reason why and proceed with request.) (Agent: If yes, when and what did the pharmacy advise?) Call provider  This is the patient's preferred pharmacy:  [facility] [address], [place] - [address] [place] [address] Phone: [phone] Fax: [phone]  Is this the correct pharmacy for this prescription? Yes If no, delete pharmacy and type the correct one.   Has the prescription been filled recently? No  Is the patient out of the medication? No  Has the patient been seen for an appointment in the last year OR does the patient have an upcoming appointment? Yes  Can we respond through MyChart? No, phone call preferred  Agent: Please be advised that Rx refills may take up to 3 business days. We ask that you follow-up with your pharmacy.

## 2024-08-13 MED ORDER — EZETIMIBE 10 MG PO TABS
10.0000 mg | ORAL_TABLET | Freq: Every day | ORAL | 0 refills | Status: AC
Start: 2024-08-13 — End: ?

## 2024-08-19 ENCOUNTER — Other Ambulatory Visit: Payer: Self-pay | Admitting: Family Medicine

## 2024-08-19 DIAGNOSIS — F419 Anxiety disorder, unspecified: Secondary | ICD-10-CM

## 2024-08-24 NOTE — Telephone Encounter (Signed)
 Rx was sent on 08/19/24

## 2024-08-24 NOTE — Telephone Encounter (Signed)
 Initial Comment Caller states patient is calling for a refill for Buspirone  Translation No Disp. Time Titus Time) Disposition Final User 08/21/2024 8:06:40 AM Attempt made - no message left Mavis OBIE Chew 08/21/2024 8:30:19 AM Attempt made - no message left Mavis OBIE Chew 08/21/2024 8:40:38 AM FINAL ATTEMPT MADE - no message left Yes Mavis RN, Chew Final Disposition 08/21/2024 8:40:38 AM FINAL ATTEMPT MADE - no message left Yes Mavis, RN, Chew Comments User: Chew Mavis, RN Date/Time Titus Time): 08/21/2024 8:06:19 AM Attempted to contact patient, line rang a few times and then an automated message was heard that stated this is a recording and then a repeating busy signal was heard User: Chew Mavis, RN Date/Time Titus Time): 08/21/2024 8:40:29 AM Now at two attempts, closing chart as final

## 2024-09-07 ENCOUNTER — Ambulatory Visit: Admitting: Cardiology

## 2024-09-09 ENCOUNTER — Other Ambulatory Visit: Payer: Self-pay

## 2024-09-09 ENCOUNTER — Inpatient Hospital Stay (HOSPITAL_BASED_OUTPATIENT_CLINIC_OR_DEPARTMENT_OTHER)
Admission: EM | Admit: 2024-09-09 | Discharge: 2024-09-14 | DRG: 335 | Disposition: A | Discharge status: Home / Self Care | Attending: Family Medicine | Admitting: Family Medicine

## 2024-09-09 ENCOUNTER — Emergency Department (HOSPITAL_BASED_OUTPATIENT_CLINIC_OR_DEPARTMENT_OTHER)

## 2024-09-09 ENCOUNTER — Encounter (HOSPITAL_BASED_OUTPATIENT_CLINIC_OR_DEPARTMENT_OTHER): Payer: Self-pay | Admitting: Emergency Medicine

## 2024-09-09 DIAGNOSIS — E872 Acidosis, unspecified: Secondary | ICD-10-CM | POA: Diagnosis present

## 2024-09-09 DIAGNOSIS — Z881 Allergy status to other antibiotic agents status: Secondary | ICD-10-CM

## 2024-09-09 DIAGNOSIS — N839 Noninflammatory disorder of ovary, fallopian tube and broad ligament, unspecified: Secondary | ICD-10-CM | POA: Diagnosis present

## 2024-09-09 DIAGNOSIS — E871 Hypo-osmolality and hyponatremia: Secondary | ICD-10-CM | POA: Diagnosis present

## 2024-09-09 DIAGNOSIS — Z87891 Personal history of nicotine dependence: Secondary | ICD-10-CM

## 2024-09-09 DIAGNOSIS — Z882 Allergy status to sulfonamides status: Secondary | ICD-10-CM

## 2024-09-09 DIAGNOSIS — R160 Hepatomegaly, not elsewhere classified: Secondary | ICD-10-CM | POA: Diagnosis present

## 2024-09-09 DIAGNOSIS — E785 Hyperlipidemia, unspecified: Secondary | ICD-10-CM | POA: Diagnosis present

## 2024-09-09 DIAGNOSIS — K7200 Acute and subacute hepatic failure without coma: Secondary | ICD-10-CM | POA: Diagnosis present

## 2024-09-09 DIAGNOSIS — Z7982 Long term (current) use of aspirin: Secondary | ICD-10-CM

## 2024-09-09 DIAGNOSIS — R109 Unspecified abdominal pain: Secondary | ICD-10-CM | POA: Diagnosis present

## 2024-09-09 DIAGNOSIS — Z90711 Acquired absence of uterus with remaining cervical stump: Secondary | ICD-10-CM

## 2024-09-09 DIAGNOSIS — R0602 Shortness of breath: Secondary | ICD-10-CM

## 2024-09-09 DIAGNOSIS — F419 Anxiety disorder, unspecified: Secondary | ICD-10-CM | POA: Diagnosis present

## 2024-09-09 DIAGNOSIS — K660 Peritoneal adhesions (postprocedural) (postinfection): Secondary | ICD-10-CM | POA: Diagnosis present

## 2024-09-09 DIAGNOSIS — R21 Rash and other nonspecific skin eruption: Secondary | ICD-10-CM | POA: Diagnosis not present

## 2024-09-09 DIAGNOSIS — I2510 Atherosclerotic heart disease of native coronary artery without angina pectoris: Secondary | ICD-10-CM | POA: Diagnosis present

## 2024-09-09 DIAGNOSIS — Z885 Allergy status to narcotic agent status: Secondary | ICD-10-CM

## 2024-09-09 DIAGNOSIS — Z955 Presence of coronary angioplasty implant and graft: Secondary | ICD-10-CM

## 2024-09-09 DIAGNOSIS — Z888 Allergy status to other drugs, medicaments and biological substances status: Secondary | ICD-10-CM

## 2024-09-09 DIAGNOSIS — Z91040 Latex allergy status: Secondary | ICD-10-CM

## 2024-09-09 DIAGNOSIS — F32A Depression, unspecified: Secondary | ICD-10-CM | POA: Diagnosis present

## 2024-09-09 DIAGNOSIS — R197 Diarrhea, unspecified: Secondary | ICD-10-CM

## 2024-09-09 DIAGNOSIS — Z7984 Long term (current) use of oral hypoglycemic drugs: Secondary | ICD-10-CM

## 2024-09-09 DIAGNOSIS — M797 Fibromyalgia: Secondary | ICD-10-CM | POA: Diagnosis present

## 2024-09-09 DIAGNOSIS — Z9049 Acquired absence of other specified parts of digestive tract: Secondary | ICD-10-CM

## 2024-09-09 DIAGNOSIS — Z1152 Encounter for screening for COVID-19: Secondary | ICD-10-CM

## 2024-09-09 DIAGNOSIS — E1151 Type 2 diabetes mellitus with diabetic peripheral angiopathy without gangrene: Secondary | ICD-10-CM | POA: Diagnosis present

## 2024-09-09 DIAGNOSIS — K760 Fatty (change of) liver, not elsewhere classified: Secondary | ICD-10-CM | POA: Diagnosis present

## 2024-09-09 DIAGNOSIS — K551 Chronic vascular disorders of intestine: Principal | ICD-10-CM | POA: Diagnosis present

## 2024-09-09 DIAGNOSIS — R079 Chest pain, unspecified: Secondary | ICD-10-CM

## 2024-09-09 DIAGNOSIS — I771 Stricture of artery: Secondary | ICD-10-CM | POA: Diagnosis present

## 2024-09-09 DIAGNOSIS — R1010 Upper abdominal pain, unspecified: Principal | ICD-10-CM

## 2024-09-09 DIAGNOSIS — R110 Nausea: Secondary | ICD-10-CM

## 2024-09-09 DIAGNOSIS — I10 Essential (primary) hypertension: Secondary | ICD-10-CM | POA: Diagnosis present

## 2024-09-09 HISTORY — DX: Type 2 diabetes mellitus without complications: E11.9

## 2024-09-09 LAB — LIPASE, BLOOD: Lipase: 32 U/L (ref 11–51)

## 2024-09-09 LAB — COMPREHENSIVE METABOLIC PANEL WITH GFR
ALT: 54 U/L — ABNORMAL HIGH (ref 0–44)
AST: 48 U/L — ABNORMAL HIGH (ref 15–41)
Albumin: 4.9 g/dL (ref 3.5–5.0)
Alkaline Phosphatase: 149 U/L — ABNORMAL HIGH (ref 38–126)
Anion gap: 15 (ref 5–15)
BUN: 8 mg/dL (ref 8–23)
CO2: 25 mmol/L (ref 22–32)
Calcium: 10 mg/dL (ref 8.9–10.3)
Chloride: 92 mmol/L — ABNORMAL LOW (ref 98–111)
Creatinine, Ser: 0.73 mg/dL (ref 0.44–1.00)
GFR, Estimated: >60 mL/min (ref >60)
Glucose, Bld: 95 mg/dL (ref 70–99)
Potassium: 4.3 mmol/L (ref 3.5–5.1)
Sodium: 132 mmol/L — ABNORMAL LOW (ref 135–145)
Total Bilirubin: 0.4 mg/dL (ref 0.0–1.2)
Total Protein: 8.1 g/dL (ref 6.5–8.1)

## 2024-09-09 LAB — URINALYSIS, MICROSCOPIC (REFLEX)

## 2024-09-09 LAB — URINALYSIS, ROUTINE W REFLEX MICROSCOPIC
Bilirubin Urine: NEGATIVE
Glucose, UA: NEGATIVE mg/dL
Hgb urine dipstick: NEGATIVE
Ketones, ur: NEGATIVE mg/dL
Nitrite: NEGATIVE
Protein, ur: NEGATIVE mg/dL
Specific Gravity, Urine: 1.015 (ref 1.005–1.030)
pH: 7 (ref 5.0–8.0)

## 2024-09-09 LAB — CBC
HCT: 38.8 % (ref 36.0–46.0)
Hemoglobin: 13.3 g/dL (ref 12.0–15.0)
MCH: 32.1 pg (ref 26.0–34.0)
MCHC: 34.3 g/dL (ref 30.0–36.0)
MCV: 93.7 fL (ref 80.0–100.0)
Platelets: 373 K/uL (ref 150–400)
RBC: 4.14 MIL/uL (ref 3.87–5.11)
RDW: 13.2 % (ref 11.5–15.5)
WBC: 8.2 K/uL (ref 4.0–10.5)
nRBC: 0 % (ref 0.0–0.2)

## 2024-09-09 LAB — RESP PANEL BY RT-PCR (RSV, FLU A&B, COVID)  RVPGX2
Influenza A by PCR: NEGATIVE
Influenza B by PCR: NEGATIVE
Resp Syncytial Virus by PCR: NEGATIVE
SARS Coronavirus 2 by RT PCR: NEGATIVE

## 2024-09-09 LAB — LACTIC ACID, PLASMA: Lactic Acid, Venous: 3.3 mmol/L (ref 0.5–1.9)

## 2024-09-09 LAB — TROPONIN T, HIGH SENSITIVITY: Troponin T High Sensitivity: <15 ng/L (ref 0–19)

## 2024-09-09 MED ORDER — SODIUM CHLORIDE 0.9 % IV BOLUS
1000.0000 mL | Freq: Once | INTRAVENOUS | Status: AC
  Administered 2024-09-09: 23:00:00 1000 mL via INTRAVENOUS

## 2024-09-09 MED ORDER — HYDROMORPHONE HCL 1 MG/ML IJ SOLN
1.0000 mg | Freq: Once | INTRAMUSCULAR | Status: AC
  Administered 2024-09-10: 1 mg via INTRAVENOUS
  Filled 2024-09-09: qty 1

## 2024-09-09 MED ORDER — ONDANSETRON HCL 4 MG/2ML IJ SOLN
4.0000 mg | Freq: Once | INTRAMUSCULAR | Status: AC
  Administered 2024-09-09: 20:00:00 4 mg via INTRAVENOUS
  Filled 2024-09-09: qty 2

## 2024-09-09 MED ORDER — HEPARIN BOLUS VIA INFUSION
4000.0000 [IU] | Freq: Once | INTRAVENOUS | Status: AC
  Administered 2024-09-10: 01:00:00 4000 [IU] via INTRAVENOUS

## 2024-09-09 MED ORDER — IOHEXOL 350 MG/ML SOLN
75.0000 mL | Freq: Once | INTRAVENOUS | Status: AC | PRN
  Administered 2024-09-09: 21:00:00 75 mL via INTRAVENOUS

## 2024-09-09 MED ORDER — ONDANSETRON HCL 4 MG/2ML IJ SOLN
4.0000 mg | Freq: Once | INTRAMUSCULAR | Status: AC
  Administered 2024-09-09: 16:00:00 4 mg via INTRAVENOUS
  Filled 2024-09-09: qty 2

## 2024-09-09 MED ORDER — HEPARIN (PORCINE) 25000 UT/250ML-% IV SOLN
950.0000 [IU]/h | INTRAVENOUS | Status: DC
  Administered 2024-09-10: 01:00:00 1100 [IU]/h via INTRAVENOUS
  Filled 2024-09-09: qty 250

## 2024-09-09 MED ORDER — IOHEXOL 300 MG/ML  SOLN
100.0000 mL | Freq: Once | INTRAMUSCULAR | Status: AC | PRN
  Administered 2024-09-09: 18:00:00 100 mL via INTRAVENOUS

## 2024-09-09 MED ORDER — HYDROMORPHONE HCL 1 MG/ML IJ SOLN
0.5000 mg | Freq: Once | INTRAMUSCULAR | Status: AC
  Administered 2024-09-09: 16:00:00 0.5 mg via INTRAVENOUS
  Filled 2024-09-09: qty 1

## 2024-09-09 MED ORDER — HYDROMORPHONE HCL 1 MG/ML IJ SOLN
1.0000 mg | Freq: Once | INTRAMUSCULAR | Status: AC
  Administered 2024-09-09: 20:00:00 1 mg via INTRAVENOUS
  Filled 2024-09-09: qty 1

## 2024-09-09 NOTE — ED Notes (Signed)
 Pt in CT.

## 2024-09-09 NOTE — ED Provider Notes (Signed)
 [place] [facility]MENT AT [facility] Provider Note   CSN: [id number] Arrival date & time: 09/09/24  1347     Patient presents with: Abdominal Pain   Sheryl Stone is a 68 y.o. female with a past medical history significant for CAD, hypertension, anxiety, type 2 diabetes, fibromyalgia, and hyperlipidemia who presents to the ED due to severe abdominal pain that has been present for the past few days.  Notes it got worse last night.  Admits to nausea and diarrhea.  Also endorses some abdominal distention.  Notes she has had a few abdominal surgeries however, unclear which ones.  Denies fever and chills.  Admits to some shortness of breath.  Also endorses some intermittent chest pain which she notes is typical for her.  Denies dysuria.   History obtained from patient and past medical records. No interpreter used during encounter.       Prior to Admission medications  Medication Sig Start Date End Date Taking? Authorizing Provider  ALPRAZolam  (XANAX ) 0.5 MG tablet Take 1 tablet (0.5 mg total) by mouth 3 (three) times daily as needed for anxiety. 06/22/24   Douglass Kenney NOVAK, FNP  aspirin  81 MG tablet Take 81 mg by mouth daily.    [provider]  atorvastatin  (LIPITOR) 80 MG tablet Take 80 mg by mouth daily.    [provider]  busPIRone  (BUSPAR ) 5 MG tablet TAKE 1 & 1/2 (ONE & ONE-HALF) TABLETS BY MOUTH TWICE DAILY 08/19/24   Beck, Taylor B, NP  Cholecalciferol ([employer] VIT D 5000 HIGH-POTENCY PO) Take by mouth.    [provider]  dicyclomine  (BENTYL ) 20 MG tablet Take 1 tablet by mouth twice daily 07/20/24   Beck, Taylor B, NP  ezetimibe  (ZETIA ) 10 MG tablet Take 1 tablet (10 mg total) by mouth daily. 08/13/24   Almarie Waddell NOVAK, NP  fluconazole  (DIFLUCAN ) 150 MG tablet Take 1 tablet (150 mg total) by mouth daily. May repeat in 3 days if needed. 07/06/24   Almarie Waddell NOVAK, NP  gabapentin  (NEURONTIN ) 100 MG capsule Take 1 capsule (100 mg total)  by mouth 3 (three) times daily. 07/06/24   Almarie Waddell NOVAK, NP  lisinopril (ZESTRIL) 5 MG tablet Take 5 mg by mouth daily.    [provider]  metFORMIN  (GLUCOPHAGE -XR) 500 MG 24 hr tablet Take 2 tablets (1,000 mg total) by mouth daily with breakfast. 08/10/24   Almarie Waddell NOVAK, NP  metoprolol  succinate (TOPROL -XL) 25 MG 24 hr tablet Take 1 tablet (25 mg total) by mouth daily. Take with or immediately following a meal 07/24/24   Almarie Waddell NOVAK, NP  Multiple Vitamin (MULTIVITAMIN) tablet Take 1 tablet by mouth daily.    [provider]  ondansetron  (ZOFRAN -ODT) 4 MG disintegrating tablet Take 1 tablet (4 mg total) by mouth every 8 (eight) hours as needed for nausea or vomiting. 06/21/24   Dreama Longs, MD  pantoprazole  (PROTONIX ) 20 MG tablet Take 2 tablets (40 mg total) by mouth daily. 08/05/24   Almarie Waddell NOVAK, NP  senna-docusate (SENOKOT-S) 8.6-50 MG tablet Take 1 tablet by mouth at bedtime. 09/23/17   Mackuen, Courteney Lyn, MD    Allergies: Latex, Codeine, Dextromethorphan, Diazepam, Diphenhydramine  hcl, Doxycycline, Elemental sulfur, Moxifloxacin, Nitrofurantoin, Penicillins, Propoxyphene, Pseudoephedrine, Sulfa antibiotics, Sulfamethoxazole, Sulfur, Tetracycline hcl, Tramadol, and Morphine    Review of Systems  Respiratory:  Positive for shortness of breath.   Cardiovascular:  Positive for chest pain.  Gastrointestinal:  Positive for abdominal distention, abdominal pain, diarrhea, nausea and  vomiting.    Updated Vital Signs BP (!) 110/54   Pulse 83   Temp 97.7 F (36.5 C)   Resp 20   Ht 5' 3 (1.6 m)   Wt 68 kg   SpO2 96%   BMI 26.57 kg/m   Physical Exam Vitals and nursing note reviewed.  Constitutional:      General: She is not in acute distress.    Appearance: She is not ill-appearing.  HENT:     Head: Normocephalic.  Eyes:     Pupils: Pupils are equal, round, and reactive to light.  Cardiovascular:     Rate and Rhythm: Normal rate and regular  rhythm.     Pulses: Normal pulses.     Heart sounds: Normal heart sounds. No murmur heard.    No friction rub. No gallop.  Pulmonary:     Effort: Pulmonary effort is normal.     Breath sounds: Normal breath sounds.  Abdominal:     General: Abdomen is flat. There is distension.     Palpations: Abdomen is soft.     Tenderness: There is abdominal tenderness. There is no guarding or rebound.  Musculoskeletal:        General: Normal range of motion.     Cervical back: Neck supple.  Skin:    General: Skin is warm and dry.  Neurological:     General: No focal deficit present.     Mental Status: She is alert.  Psychiatric:        Mood and Affect: Mood normal.        Behavior: Behavior normal.     (all labs ordered are listed, but only abnormal results are displayed) Labs Reviewed  COMPREHENSIVE METABOLIC PANEL WITH GFR - Abnormal; Notable for the following components:      Result Value   Sodium 132 (*)    Chloride 92 (*)    AST 48 (*)    ALT 54 (*)    Alkaline Phosphatase 149 (*)    All other components within normal limits  URINALYSIS, ROUTINE W REFLEX MICROSCOPIC - Abnormal; Notable for the following components:   Leukocytes,Ua TRACE (*)    All other components within normal limits  URINALYSIS, MICROSCOPIC (REFLEX) - Abnormal; Notable for the following components:   Bacteria, UA FEW (*)    Non Squamous Epithelial PRESENT (*)    All other components within normal limits  RESP PANEL BY RT-PCR (RSV, FLU A&B, COVID)  [id number]  URINE CULTURE  LIPASE, BLOOD  CBC  LACTIC ACID, PLASMA  LACTIC ACID, PLASMA  TROPONIN T, HIGH SENSITIVITY    EKG: EKG Interpretation Date/Time:  Wednesday September 09 2024 14:18:28 EST Ventricular Rate:  92 PR Interval:  194 QRS Duration:  102 QT Interval:  376 QTC Calculation: 466 R Axis:   34  Text Interpretation: Sinus rhythm Abnormal R-wave progression, early transition Confirmed by Neysa Clap [phone]) on 09/09/2024 2:58:31  PM  Radiology: CT ABDOMEN PELVIS W CONTRAST Result Date: 09/09/2024 EXAM: CT ABDOMEN AND PELVIS WITH CONTRAST 09/09/2024 06:11:50 PM TECHNIQUE: CT of the abdomen and pelvis was performed with the administration of 100 mL of iohexol  (OMNIPAQUE ) 300 MG/ML solution. Multiplanar reformatted images are provided for review. Automated exposure control, iterative reconstruction, and/or weight-based adjustment of the mA/kV was utilized to reduce the radiation dose to as low as reasonably achievable. COMPARISON: Prior examinations of 12/19/2023 and 09/23/2017. CLINICAL HISTORY: Abdominal pain, acute, nonlocalized. FINDINGS: LOWER CHEST: No acute abnormality. LIVER: Moderate hepatic steatosis and hepatomegaly. GALLBLADDER  AND BILE DUCTS: Status post cholecystectomy. Mild dilation of the biliary tree likely reflects post cholecystectomy change. Correlation with liver enzymes may be helpful to exclude an obstructive or inflammatory process. SPLEEN: No acute abnormality. PANCREAS: No acute abnormality. ADRENAL GLANDS: No acute abnormality. KIDNEYS, URETERS AND BLADDER: No stones in the kidneys or ureters. No hydronephrosis. No perinephric or periureteral stranding. Urinary bladder is unremarkable. Particularly prominent atherosclerotic calcifications seen at the origin of the right renal artery. If there is clinical evidence of hemodynamically significant renal artery stenosis, CT arteriography may be more helpful for further evaluation. GI AND BOWEL: Moderate sigmoid diverticulosis. Appendix absent. The stomach, small bowel, and large bowel are otherwise unremarkable. There is no bowel obstruction. PERITONEUM AND RETROPERITONEUM: No ascites. No free air. VASCULATURE: Extensive aortoiliac atherosclerotic calcifications. Particularly prominent atherosclerotic calcifications seen at the origin of the mesenteric and bilateral lower extremity arterial inflow. Peripheral vascular disease. If there is clinical evidence of chronic  mesenteric ischemia or lower extremity claudication, CT arteriography may be more helpful for further evaluation. LYMPH NODES: No lymphadenopathy. REPRODUCTIVE ORGANS: Uterus is absent. Residual left ovaries unremarkable. 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 This mass measures 4.2 x 4.5 x 3.3 cm. Volume calculated as 32.7 cm. Gynecologic consultation is recommended for further evaluation. BONES AND SOFT TISSUES: No acute osseous abnormality. No focal soft tissue abnormality. RAF SCORE: Aortic atherosclerosis (ICD10-I70.0) IMPRESSION: 1. 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 2. Moderate hepatic steatosis and hepatomegaly. 3. Mild dilation of the biliary tree likely reflects post cholecystectomy change, and correlation with liver enzymes may be helpful to exclude an obstructive or inflammatory process. 4. Peripheral vascular disease, and if there is clinical evidence of hemodynamically significant renal artery stenosis, chronic mesenteric ischemia, or lower extremity claudication, CT arteriography may be more helpful for further evaluation. 5. Moderate sigmoid diverticulosis without evidence of diverticulitis. 6. RAF score includes Aortic atherosclerosis (ICD10-I70.0). Electronically signed by: Dorethia Molt MD 09/09/2024 06:26 PM EST RP Workstation: [id number]   DG Chest Portable 1 View Result Date: 09/09/2024 CLINICAL DATA:  Shortness of breath and chest pain. EXAM: PORTABLE CHEST 1 VIEW COMPARISON:  None Available. FINDINGS: No focal consolidation,  pleural effusion, or pneumothorax. Background of emphysema. Mild cardiomegaly. Atherosclerotic calcification of the aorta. No acute osseous pathology. IMPRESSION: 1. No active disease. 2. Mild cardiomegaly. Electronically Signed   By: Vanetta Chou M.D.   On: 09/09/2024 15:18     Procedures   Medications Ordered in the ED  ondansetron  (ZOFRAN ) injection 4 mg (4 mg Intravenous Given 09/09/24 1620)  HYDROmorphone  (DILAUDID ) injection 0.5 mg (0.5 mg Intravenous Given 09/09/24 1620)  iohexol  (OMNIPAQUE ) 300 MG/ML solution 100 mL (100 mLs Intravenous Contrast Given 09/09/24 1758)    Clinical Course as of 09/09/24 1850  Wed Sep 09, 2024  1628 BP elevated.  Reported to bedside.  Patient just receiving pain medication.  Suspect elevated BP likely secondary to pain.  Will recheck in 15 minutes.  Patient notes she took her BP medications this morning. [CA]  1653 Rechecked BP. 127/60 [CA]  1727 Alkaline Phosphatase(!): 149 [CA]  1727 ALT(!): 54 [CA]  1727  AST(!): 48 [CA]    Clinical Course User Index [CA] Lorelle Aleck BROCKS, PA-C                                 Medical Decision Making Amount and/or Complexity of Data Reviewed Independent Historian: spouse External Data Reviewed: notes. Labs: ordered. Decision-making details documented in ED Course. Radiology: ordered and independent interpretation performed. Decision-making details documented in ED Course. ECG/medicine tests: ordered and independent interpretation performed. Decision-making details documented in ED Course.  Risk Prescription drug management.   This patient presents to the ED for concern of abdominal pain, this involves an extensive number of treatment options, and is a complaint that carries with it a high risk of complications and morbidity.  The differential diagnosis includes bowel obstruction, diverticulitis, gastroenteritis, pancreatitis, etc  68 year old female presents to the ED due to abdominal pain for the  past few days which worsened last night.  Developed nausea and diarrhea yesterday.  No fever or chills.  Also endorses some shortness of breath and chest pain.  Upon arrival patient afebrile, tachycardic at 103 with normal O2 saturation.  Patient appears uncomfortable.  Abdomen distended? Diffuse tenderness. No lower extremity edema. CT abdomen ordered. Routine labs.  IV Dilaudid  and Zofran  given.  CBC unremarkable.  No leukocytosis.  Normal hemoglobin.  CMP with transaminitis.  Alk phos 149, ALT 54, AST 48.  Normal total bilirubin.  Normal renal function.  Lipase normal.  Low suspicion for pancreatitis.  UA with trace leukocytes, few bacteria, 11-20 squamous cells and non-squamous cells.  Urine appears contaminated.  Patient denies any urinary symptoms.  Will hold off on antibiotics for acute cystitis.  Urine culture pending. RVP negative. Troponin normal.  EKG normal sinus rhythm.  No signs acute ischemia.  Low suspicion for ACS.  Chest x-ray personally reviewed and interpreted as negative for signs of pneumonia, pneumothorax, widened mediastinum.  Does demonstrate mild cardiomegaly.  Unclear what is causing patient's chest pain and shortness of breath.  No wheeze to suggest COPD/asthma.  No lower extremity edema to suggest CHF.  Low suspicion for PE/DVT.  CT abdomen personally reviewed and interpreted which demonstrates a right ovarian solid and cystic mass suspicious for ovarian neoplasm.  Also demonstrates mild dilatation of the biliary tree.  Patient does have transaminitis.  Also demonstrates peripheral vascular disease.  Given patient having severe 10/10 pain.  CTA abdomen ordered.  Added lactic acid.  Patient handed off to Sarah Smoot, PA-C at shift change pending CTA abdomen.   Co morbidities that complicate the patient evaluation  CAD, HTN, HL Cardiac Monitoring: / EKG:  The patient was maintained on a cardiac monitor.  I personally viewed and interpreted the cardiac monitored which showed an  underlying rhythm of: NSR  Social Determinants of Health:  Elderly >65  Test / Admission - Considered:  Disposition pending at shift change pending CTA abdomen  Discussed with Dr. Ginger who evaluated patient at bedside and agrees with assessment and plan.     Final diagnoses:  Pain of upper abdomen  Nausea  Diarrhea, unspecified type  Shortness of breath  Nonspecific chest pain    ED Discharge Orders     None          Lorelle Aleck BROCKS DEVONNA 09/09/24 1858    Tegeler, Lonni PARAS, MD 09/09/24 [phone]

## 2024-09-09 NOTE — ED Triage Notes (Signed)
 Pt c/o middle and upper abd pain since last night; has been having pain, but it became worse last night; describes as cramping; +nausea, diarrhea

## 2024-09-09 NOTE — Progress Notes (Signed)
 PHARMACY - ANTICOAGULATION CONSULT NOTE  Pharmacy Consult for heparin  Indication: mesenteric ischemia  Allergies[1]  Patient Measurements: Height: 5' 3 (160 cm) Weight: 68 kg (150 lb) IBW/kg (Calculated) : 52.4 HEPARIN  DW (KG): 66.3  Vital Signs: Temp: 98.1 F (36.7 C) (12/17 2124) Temp Source: Oral (12/17 2124) BP: 157/72 (12/17 2200) Pulse Rate: 86 (12/17 2200)  Labs: Recent Labs    09/09/24 1543 09/09/24 1628  HGB  --  13.3  HCT  --  38.8  PLT  --  373  CREATININE 0.73  --     Estimated Creatinine Clearance: 60.5 mL/min (by C-G formula based on SCr of 0.73 mg/dL).   Medical History: Past Medical History:  Diagnosis Date   Anxiety and depression    Back pain    Fibromyalgia    Hypertension    Panic attacks     Assessment: 68yo female c/o abdominal pain associated with nausea and diarrhea, imaging concerning for mesenteric ischemia >> to begin heparin .  Goal of Therapy:  Heparin  level 0.3-0.7 units/ml Monitor platelets by anticoagulation protocol: Yes   Plan:  Heparin  4000 units IV bolus followed by infusion at 1100 units/hr. Monitor heparin  levels and CBC.  Marvetta Dauphin, PharmD, BCPS  09/09/2024,11:30 PM      [1]  Allergies Allergen Reactions   Latex Itching   Codeine Other (See Comments)    Other Reaction(s): Unknown   Dextromethorphan     Other Reaction(s): hyper   Diazepam     Other Reaction(s): Unknown   Diphenhydramine  Hcl     Other Reaction(s): Unknown   Doxycycline     Other Reaction(s): Unknown   Elemental Sulfur    Moxifloxacin     Other Reaction(s): diarrhea   Nitrofurantoin     Other Reaction(s): Unknown   Penicillins Other (See Comments)    All Cillins  Other Reaction(s): Unknown   Propoxyphene     Other Reaction(s): Unknown   Pseudoephedrine     Other Reaction(s): excess energy   Sulfa Antibiotics    Sulfamethoxazole     Other Reaction(s): Unknown   Sulfur Other (See Comments)   Tetracycline Hcl     Other  Reaction(s): Unknown   Tramadol Other (See Comments)    Hyper sedation  Other Reaction(s): Unknown   Morphine Dermatitis

## 2024-09-09 NOTE — ED Notes (Signed)
 EDP made aware of repeat lactic no orders for fluid reces at this time. Will continue to monitor.

## 2024-09-10 ENCOUNTER — Encounter (HOSPITAL_COMMUNITY): Payer: Self-pay | Admitting: Internal Medicine

## 2024-09-10 ENCOUNTER — Inpatient Hospital Stay (HOSPITAL_COMMUNITY): Admitting: Anesthesiology

## 2024-09-10 ENCOUNTER — Inpatient Hospital Stay (HOSPITAL_COMMUNITY)

## 2024-09-10 ENCOUNTER — Encounter (HOSPITAL_COMMUNITY): Admission: EM | Payer: Self-pay | Source: Home / Self Care | Attending: Internal Medicine

## 2024-09-10 ENCOUNTER — Other Ambulatory Visit: Payer: Self-pay

## 2024-09-10 DIAGNOSIS — F419 Anxiety disorder, unspecified: Secondary | ICD-10-CM | POA: Diagnosis present

## 2024-09-10 DIAGNOSIS — R109 Unspecified abdominal pain: Secondary | ICD-10-CM | POA: Diagnosis present

## 2024-09-10 DIAGNOSIS — I2510 Atherosclerotic heart disease of native coronary artery without angina pectoris: Secondary | ICD-10-CM | POA: Diagnosis present

## 2024-09-10 DIAGNOSIS — N839 Noninflammatory disorder of ovary, fallopian tube and broad ligament, unspecified: Secondary | ICD-10-CM | POA: Diagnosis present

## 2024-09-10 DIAGNOSIS — F418 Other specified anxiety disorders: Secondary | ICD-10-CM | POA: Diagnosis not present

## 2024-09-10 DIAGNOSIS — Z881 Allergy status to other antibiotic agents status: Secondary | ICD-10-CM | POA: Diagnosis not present

## 2024-09-10 DIAGNOSIS — Z87891 Personal history of nicotine dependence: Secondary | ICD-10-CM | POA: Diagnosis not present

## 2024-09-10 DIAGNOSIS — I10 Essential (primary) hypertension: Secondary | ICD-10-CM | POA: Diagnosis present

## 2024-09-10 DIAGNOSIS — Z7984 Long term (current) use of oral hypoglycemic drugs: Secondary | ICD-10-CM | POA: Diagnosis not present

## 2024-09-10 DIAGNOSIS — Z1152 Encounter for screening for COVID-19: Secondary | ICD-10-CM | POA: Diagnosis not present

## 2024-09-10 DIAGNOSIS — E785 Hyperlipidemia, unspecified: Secondary | ICD-10-CM | POA: Diagnosis present

## 2024-09-10 DIAGNOSIS — R7989 Other specified abnormal findings of blood chemistry: Secondary | ICD-10-CM | POA: Diagnosis not present

## 2024-09-10 DIAGNOSIS — I771 Stricture of artery: Secondary | ICD-10-CM | POA: Diagnosis present

## 2024-09-10 DIAGNOSIS — K551 Chronic vascular disorders of intestine: Secondary | ICD-10-CM | POA: Diagnosis present

## 2024-09-10 DIAGNOSIS — F32A Depression, unspecified: Secondary | ICD-10-CM | POA: Diagnosis present

## 2024-09-10 DIAGNOSIS — K760 Fatty (change of) liver, not elsewhere classified: Secondary | ICD-10-CM | POA: Diagnosis present

## 2024-09-10 DIAGNOSIS — Z882 Allergy status to sulfonamides status: Secondary | ICD-10-CM | POA: Diagnosis not present

## 2024-09-10 DIAGNOSIS — R197 Diarrhea, unspecified: Secondary | ICD-10-CM | POA: Diagnosis not present

## 2024-09-10 DIAGNOSIS — E872 Acidosis, unspecified: Secondary | ICD-10-CM | POA: Diagnosis present

## 2024-09-10 DIAGNOSIS — E871 Hypo-osmolality and hyponatremia: Secondary | ICD-10-CM | POA: Diagnosis present

## 2024-09-10 DIAGNOSIS — M797 Fibromyalgia: Secondary | ICD-10-CM | POA: Diagnosis present

## 2024-09-10 DIAGNOSIS — K55059 Acute (reversible) ischemia of intestine, part and extent unspecified: Secondary | ICD-10-CM | POA: Diagnosis not present

## 2024-09-10 DIAGNOSIS — R160 Hepatomegaly, not elsewhere classified: Secondary | ICD-10-CM | POA: Diagnosis present

## 2024-09-10 DIAGNOSIS — K7200 Acute and subacute hepatic failure without coma: Secondary | ICD-10-CM | POA: Diagnosis present

## 2024-09-10 DIAGNOSIS — Z7982 Long term (current) use of aspirin: Secondary | ICD-10-CM | POA: Diagnosis not present

## 2024-09-10 DIAGNOSIS — R0602 Shortness of breath: Secondary | ICD-10-CM | POA: Diagnosis present

## 2024-09-10 DIAGNOSIS — K449 Diaphragmatic hernia without obstruction or gangrene: Secondary | ICD-10-CM | POA: Diagnosis not present

## 2024-09-10 DIAGNOSIS — K660 Peritoneal adhesions (postprocedural) (postinfection): Secondary | ICD-10-CM | POA: Diagnosis present

## 2024-09-10 DIAGNOSIS — K559 Vascular disorder of intestine, unspecified: Secondary | ICD-10-CM | POA: Diagnosis not present

## 2024-09-10 DIAGNOSIS — K7589 Other specified inflammatory liver diseases: Secondary | ICD-10-CM | POA: Diagnosis not present

## 2024-09-10 DIAGNOSIS — R21 Rash and other nonspecific skin eruption: Secondary | ICD-10-CM | POA: Diagnosis not present

## 2024-09-10 DIAGNOSIS — E1151 Type 2 diabetes mellitus with diabetic peripheral angiopathy without gangrene: Secondary | ICD-10-CM | POA: Diagnosis present

## 2024-09-10 DIAGNOSIS — Z885 Allergy status to narcotic agent status: Secondary | ICD-10-CM | POA: Diagnosis not present

## 2024-09-10 DIAGNOSIS — R1010 Upper abdominal pain, unspecified: Secondary | ICD-10-CM | POA: Diagnosis not present

## 2024-09-10 DIAGNOSIS — R748 Abnormal levels of other serum enzymes: Secondary | ICD-10-CM | POA: Diagnosis not present

## 2024-09-10 HISTORY — PX: ULTRASOUND GUIDANCE FOR VASCULAR ACCESS: SHX6516

## 2024-09-10 HISTORY — PX: ABDOMINAL AORTIC ENDOVASCULAR STENT GRAFT: SHX5707

## 2024-09-10 HISTORY — PX: LAPAROSCOPY: SHX197

## 2024-09-10 HISTORY — PX: AORTOGRAM: SHX6300

## 2024-09-10 LAB — HIV ANTIBODY (ROUTINE TESTING W REFLEX): HIV Screen 4th Generation wRfx: NONREACTIVE

## 2024-09-10 LAB — LACTIC ACID, PLASMA
Lactic Acid, Venous: 1.7 mmol/L (ref 0.5–1.9)
Lactic Acid, Venous: 2.4 mmol/L (ref 0.5–1.9)
Lactic Acid, Venous: 2.7 mmol/L (ref 0.5–1.9)
Lactic Acid, Venous: 2.8 mmol/L (ref 0.5–1.9)

## 2024-09-10 LAB — GLUCOSE, CAPILLARY
Glucose-Capillary: 154 mg/dL — ABNORMAL HIGH (ref 70–99)
Glucose-Capillary: 156 mg/dL — ABNORMAL HIGH (ref 70–99)
Glucose-Capillary: 164 mg/dL — ABNORMAL HIGH (ref 70–99)
Glucose-Capillary: 199 mg/dL — ABNORMAL HIGH (ref 70–99)

## 2024-09-10 LAB — TROPONIN T, HIGH SENSITIVITY: Troponin T High Sensitivity: <15 ng/L (ref 0–19)

## 2024-09-10 LAB — ABO/RH: ABO/RH(D): A POS

## 2024-09-10 LAB — HEPARIN LEVEL (UNFRACTIONATED): Heparin Unfractionated: 0.92 [IU]/mL — ABNORMAL HIGH (ref 0.30–0.70)

## 2024-09-10 LAB — TYPE AND SCREEN
ABO/RH(D): A POS
Antibody Screen: NEGATIVE

## 2024-09-10 LAB — SURGICAL PCR SCREEN
MRSA, PCR: NEGATIVE
Staphylococcus aureus: NEGATIVE

## 2024-09-10 LAB — CBG MONITORING, ED: Glucose-Capillary: 145 mg/dL — ABNORMAL HIGH (ref 70–99)

## 2024-09-10 SURGERY — AORTOGRAM
Anesthesia: General | Site: Groin

## 2024-09-10 MED ORDER — FENTANYL CITRATE (PF) 250 MCG/5ML IJ SOLN
INTRAMUSCULAR | Status: AC
  Filled 2024-09-10: qty 5

## 2024-09-10 MED ORDER — SODIUM CHLORIDE 0.9% FLUSH: 3.0000 mL | INTRAVENOUS | Status: DC | PRN

## 2024-09-10 MED ORDER — ALBUTEROL SULFATE HFA 108 (90 BASE) MCG/ACT IN AERS
INHALATION_SPRAY | RESPIRATORY_TRACT | Status: AC
  Filled 2024-09-10: qty 6.7

## 2024-09-10 MED ORDER — SODIUM CHLORIDE 0.9 % IV SOLN: INTRAVENOUS | Status: DC

## 2024-09-10 MED ORDER — CLOPIDOGREL BISULFATE 75 MG PO TABS
300.0000 mg | ORAL_TABLET | Freq: Once | ORAL | Status: AC
  Administered 2024-09-10: 22:00:00 300 mg via ORAL
  Filled 2024-09-10: qty 4

## 2024-09-10 MED ORDER — INSULIN ASPART 100 UNIT/ML IJ SOLN: 0.0000 [IU] | INTRAMUSCULAR | Status: DC | PRN

## 2024-09-10 MED ORDER — DIPHENHYDRAMINE HCL 50 MG/ML IJ SOLN
INTRAMUSCULAR | Status: DC | PRN
  Administered 2024-09-10: 16:00:00 25 mg via INTRAVENOUS

## 2024-09-10 MED ORDER — LABETALOL HCL 5 MG/ML IV SOLN
INTRAVENOUS | Status: AC
  Filled 2024-09-10: qty 4

## 2024-09-10 MED ORDER — ESMOLOL HCL 100 MG/10ML IV SOLN
INTRAVENOUS | Status: DC | PRN
  Administered 2024-09-10: 16:00:00 20 mg via INTRAVENOUS

## 2024-09-10 MED ORDER — ORAL CARE MOUTH RINSE: 15.0000 mL | Freq: Once | OROMUCOSAL | Status: AC

## 2024-09-10 MED ORDER — DIPHENHYDRAMINE HCL 50 MG/ML IJ SOLN
INTRAMUSCULAR | Status: AC
  Filled 2024-09-10: qty 1

## 2024-09-10 MED ORDER — CEFOTETAN DISODIUM 2 G IJ SOLR: 2.0000 g | INTRAMUSCULAR | Status: AC

## 2024-09-10 MED ORDER — ONDANSETRON HCL 4 MG/2ML IJ SOLN
INTRAMUSCULAR | Status: DC | PRN
  Administered 2024-09-10: 18:00:00 4 mg via INTRAVENOUS

## 2024-09-10 MED ORDER — LIDOCAINE 2% (20 MG/ML) 5 ML SYRINGE
INTRAMUSCULAR | Status: DC | PRN
  Administered 2024-09-10: 16:00:00 60 mg via INTRAVENOUS

## 2024-09-10 MED ORDER — PROPOFOL 10 MG/ML IV BOLUS
INTRAVENOUS | Status: DC | PRN
  Administered 2024-09-10: 16:00:00 100 mg via INTRAVENOUS

## 2024-09-10 MED ORDER — FENTANYL CITRATE (PF) 250 MCG/5ML IJ SOLN
INTRAMUSCULAR | Status: DC | PRN
  Administered 2024-09-10: 18:00:00 50 ug via INTRAVENOUS
  Administered 2024-09-10: 16:00:00 150 ug via INTRAVENOUS
  Administered 2024-09-10: 18:00:00 50 ug via INTRAVENOUS

## 2024-09-10 MED ORDER — SODIUM CHLORIDE 0.9 % IV SOLN: INTRAVENOUS | Status: DC | PRN

## 2024-09-10 MED ORDER — IODIXANOL 320 MG/ML IV SOLN
INTRAVENOUS | Status: DC | PRN
  Administered 2024-09-10: 15:00:00 40 mL via INTRA_ARTERIAL

## 2024-09-10 MED ORDER — PHENYLEPHRINE 80 MCG/ML (10ML) SYRINGE FOR IV PUSH (FOR BLOOD PRESSURE SUPPORT)
PREFILLED_SYRINGE | INTRAVENOUS | Status: DC | PRN
  Administered 2024-09-10: 18:00:00 160 ug via INTRAVENOUS
  Administered 2024-09-10: 16:00:00 80 ug via INTRAVENOUS
  Administered 2024-09-10 (×2): 160 ug via INTRAVENOUS

## 2024-09-10 MED ORDER — MIDAZOLAM HCL (PF) 2 MG/2ML IJ SOLN
INTRAMUSCULAR | Status: DC | PRN
  Administered 2024-09-10 (×2): .5 mg via INTRAVENOUS

## 2024-09-10 MED ORDER — SODIUM CHLORIDE 0.9 % IV SOLN
2.0000 g | Freq: Once | INTRAVENOUS | Status: AC
  Administered 2024-09-10: 01:00:00 2 g via INTRAVENOUS
  Filled 2024-09-10: qty 20

## 2024-09-10 MED ORDER — ALBUTEROL SULFATE HFA 108 (90 BASE) MCG/ACT IN AERS
INHALATION_SPRAY | RESPIRATORY_TRACT | Status: DC | PRN
  Administered 2024-09-10: 18:00:00 6 via RESPIRATORY_TRACT

## 2024-09-10 MED ORDER — SODIUM CHLORIDE 0.9% FLUSH
3.0000 mL | Freq: Two times a day (BID) | INTRAVENOUS | Status: DC
  Administered 2024-09-10 – 2024-09-13 (×5): 3 mL via INTRAVENOUS

## 2024-09-10 MED ORDER — HYDROMORPHONE HCL 1 MG/ML IJ SOLN
1.0000 mg | Freq: Once | INTRAMUSCULAR | Status: AC
  Administered 2024-09-10: 10:00:00 1 mg via INTRAVENOUS
  Filled 2024-09-10: qty 1

## 2024-09-10 MED ORDER — BUPIVACAINE HCL (PF) 0.25 % IJ SOLN
INTRAMUSCULAR | Status: AC
  Filled 2024-09-10: qty 20

## 2024-09-10 MED ORDER — SODIUM CHLORIDE 0.9 % IV SOLN: 250.0000 mL | INTRAVENOUS | Status: AC | PRN

## 2024-09-10 MED ORDER — DEXAMETHASONE SOD PHOSPHATE PF 10 MG/ML IJ SOLN
INTRAMUSCULAR | Status: DC | PRN
  Administered 2024-09-10: 16:00:00 5 mg via INTRAVENOUS

## 2024-09-10 MED ORDER — MIDAZOLAM HCL 2 MG/2ML IJ SOLN
INTRAMUSCULAR | Status: AC
  Filled 2024-09-10: qty 2

## 2024-09-10 MED ORDER — ESMOLOL HCL 100 MG/10ML IV SOLN
INTRAVENOUS | Status: AC
  Filled 2024-09-10: qty 10

## 2024-09-10 MED ORDER — ONDANSETRON HCL 4 MG/2ML IJ SOLN: 4.0000 mg | Freq: Once | INTRAMUSCULAR | Status: DC | PRN

## 2024-09-10 MED ORDER — ONDANSETRON HCL 4 MG/2ML IJ SOLN
4.0000 mg | Freq: Once | INTRAMUSCULAR | Status: AC
  Administered 2024-09-10: 4 mg via INTRAVENOUS
  Filled 2024-09-10: qty 2

## 2024-09-10 MED ORDER — CHLORHEXIDINE GLUCONATE 0.12 % MT SOLN
OROMUCOSAL | Status: AC
  Administered 2024-09-10: 14:00:00 15 mL via OROMUCOSAL
  Filled 2024-09-10: qty 15

## 2024-09-10 MED ORDER — AMISULPRIDE (ANTIEMETIC) 5 MG/2ML IV SOLN: 10.0000 mg | Freq: Once | INTRAVENOUS | Status: DC | PRN

## 2024-09-10 MED ORDER — ASPIRIN 81 MG PO TBEC
81.0000 mg | DELAYED_RELEASE_TABLET | Freq: Every day | ORAL | Status: DC
  Administered 2024-09-11 – 2024-09-14 (×4): 81 mg via ORAL
  Filled 2024-09-10 (×4): qty 1

## 2024-09-10 MED ORDER — EZETIMIBE 10 MG PO TABS: 10.0000 mg | ORAL_TABLET | Freq: Every day | ORAL | Status: DC

## 2024-09-10 MED ORDER — NOREPINEPHRINE 4 MG/250ML-% IV SOLN
INTRAVENOUS | Status: DC | PRN
  Administered 2024-09-10: 17:00:00 2 ug/min via INTRAVENOUS

## 2024-09-10 MED ORDER — 0.9 % SODIUM CHLORIDE (POUR BTL) OPTIME
TOPICAL | Status: DC | PRN
  Administered 2024-09-10: 15:00:00 1000 mL

## 2024-09-10 MED ORDER — ONDANSETRON HCL 4 MG PO TABS
4.0000 mg | ORAL_TABLET | Freq: Four times a day (QID) | ORAL | Status: DC | PRN
  Administered 2024-09-11: 4 mg via ORAL
  Filled 2024-09-10: qty 1

## 2024-09-10 MED ORDER — ALBUMIN HUMAN 5 % IV SOLN: INTRAVENOUS | Status: DC | PRN

## 2024-09-10 MED ORDER — FENTANYL CITRATE (PF) 100 MCG/2ML IJ SOLN
25.0000 ug | INTRAMUSCULAR | Status: DC | PRN
  Administered 2024-09-10: 19:00:00 25 ug via INTRAVENOUS

## 2024-09-10 MED ORDER — LABETALOL HCL 5 MG/ML IV SOLN
INTRAVENOUS | Status: DC | PRN
  Administered 2024-09-10 (×6): 10 mg via INTRAVENOUS

## 2024-09-10 MED ORDER — LACTATED RINGERS IV SOLN: INTRAVENOUS | Status: DC

## 2024-09-10 MED ORDER — CHLORHEXIDINE GLUCONATE 0.12 % MT SOLN
15.0000 mL | Freq: Once | OROMUCOSAL | Status: AC
  Filled 2024-09-10: qty 15

## 2024-09-10 MED ORDER — FENTANYL CITRATE (PF) 100 MCG/2ML IJ SOLN
INTRAMUSCULAR | Status: AC
  Filled 2024-09-10: qty 2

## 2024-09-10 MED ORDER — HYDROXYZINE HCL 25 MG PO TABS
25.0000 mg | ORAL_TABLET | Freq: Once | ORAL | Status: AC
  Administered 2024-09-10: 25 mg via ORAL
  Filled 2024-09-10: qty 1

## 2024-09-10 MED ORDER — BUPIVACAINE-EPINEPHRINE (PF) 0.25% -1:200000 IJ SOLN
INTRAMUSCULAR | Status: AC
  Filled 2024-09-10: qty 30

## 2024-09-10 MED ORDER — PROPOFOL 10 MG/ML IV BOLUS
INTRAVENOUS | Status: AC
  Filled 2024-09-10: qty 20

## 2024-09-10 MED ORDER — BUPIVACAINE-EPINEPHRINE 0.25% -1:200000 IJ SOLN
INTRAMUSCULAR | Status: DC | PRN
  Administered 2024-09-10: 18:00:00 13 mL

## 2024-09-10 MED ORDER — ATORVASTATIN CALCIUM 80 MG PO TABS: 80.0000 mg | ORAL_TABLET | Freq: Every day | ORAL | Status: DC

## 2024-09-10 MED ORDER — HEPARIN SODIUM (PORCINE) 1000 UNIT/ML IJ SOLN
INTRAMUSCULAR | Status: AC
  Filled 2024-09-10: qty 10

## 2024-09-10 MED ORDER — CLOPIDOGREL BISULFATE 75 MG PO TABS
75.0000 mg | ORAL_TABLET | Freq: Every day | ORAL | Status: DC
  Administered 2024-09-11 – 2024-09-14 (×4): 75 mg via ORAL
  Filled 2024-09-10 (×4): qty 1

## 2024-09-10 MED ORDER — ACETAMINOPHEN 10 MG/ML IV SOLN
INTRAVENOUS | Status: AC
  Filled 2024-09-10: qty 100

## 2024-09-10 MED ORDER — ONDANSETRON HCL 4 MG/2ML IJ SOLN
4.0000 mg | Freq: Once | INTRAMUSCULAR | Status: AC
  Administered 2024-09-10: 10:00:00 4 mg via INTRAVENOUS
  Filled 2024-09-10: qty 2

## 2024-09-10 MED ORDER — SUGAMMADEX SODIUM 200 MG/2ML IV SOLN
INTRAVENOUS | Status: DC | PRN
  Administered 2024-09-10: 18:00:00 124 mg via INTRAVENOUS

## 2024-09-10 MED ORDER — METRONIDAZOLE 500 MG/100ML IV SOLN
500.0000 mg | Freq: Once | INTRAVENOUS | Status: AC
  Administered 2024-09-10: 02:00:00 500 mg via INTRAVENOUS
  Filled 2024-09-10: qty 100

## 2024-09-10 MED ORDER — HEPARIN 6000 UNIT IRRIGATION SOLUTION
Status: DC | PRN
  Administered 2024-09-10: 15:00:00 1

## 2024-09-10 MED ORDER — CEFAZOLIN SODIUM-DEXTROSE 2-3 GM-%(50ML) IV SOLR
INTRAVENOUS | Status: DC | PRN
  Administered 2024-09-10: 17:00:00 2 g via INTRAVENOUS

## 2024-09-10 MED ORDER — ACETAMINOPHEN 650 MG RE SUPP: 650.0000 mg | Freq: Four times a day (QID) | RECTAL | Status: DC | PRN

## 2024-09-10 MED ORDER — ASPIRIN 325 MG PO TABS
325.0000 mg | ORAL_TABLET | Freq: Once | ORAL | Status: AC
  Administered 2024-09-10: 22:00:00 325 mg via ORAL
  Filled 2024-09-10: qty 1

## 2024-09-10 MED ORDER — INSULIN ASPART 100 UNIT/ML IJ SOLN
0.0000 [IU] | Freq: Four times a day (QID) | INTRAMUSCULAR | Status: DC
  Administered 2024-09-10: 23:00:00 2 [IU] via SUBCUTANEOUS
  Administered 2024-09-11 (×2): 1 [IU] via SUBCUTANEOUS
  Administered 2024-09-11: 2 [IU] via SUBCUTANEOUS
  Administered 2024-09-12 (×2): 1 [IU] via SUBCUTANEOUS
  Administered 2024-09-13: 2 [IU] via SUBCUTANEOUS
  Administered 2024-09-13: 1 [IU] via SUBCUTANEOUS
  Administered 2024-09-13: 2 [IU] via SUBCUTANEOUS
  Administered 2024-09-13 – 2024-09-14 (×3): 1 [IU] via SUBCUTANEOUS
  Filled 2024-09-10 (×4): qty 1
  Filled 2024-09-10: qty 2
  Filled 2024-09-10 (×8): qty 1

## 2024-09-10 MED ORDER — ALPRAZOLAM 0.5 MG PO TABS
0.5000 mg | ORAL_TABLET | Freq: Every day | ORAL | Status: DC | PRN
  Administered 2024-09-10: 21:00:00 0.5 mg via ORAL
  Filled 2024-09-10: qty 1

## 2024-09-10 MED ORDER — HEPARIN 6000 UNIT IRRIGATION SOLUTION
Status: AC
  Filled 2024-09-10: qty 1000

## 2024-09-10 MED ORDER — ACETAMINOPHEN 10 MG/ML IV SOLN
1000.0000 mg | Freq: Once | INTRAVENOUS | Status: DC | PRN
  Administered 2024-09-10: 19:00:00 1000 mg via INTRAVENOUS

## 2024-09-10 MED ORDER — HYDROMORPHONE HCL 1 MG/ML IJ SOLN
0.5000 mg | INTRAMUSCULAR | Status: DC | PRN
  Administered 2024-09-10: 21:00:00 0.5 mg via INTRAVENOUS
  Administered 2024-09-11: 1 mg via INTRAVENOUS
  Administered 2024-09-11 (×3): 0.5 mg via INTRAVENOUS
  Administered 2024-09-12: 1 mg via INTRAVENOUS
  Administered 2024-09-12: 0.5 mg via INTRAVENOUS
  Filled 2024-09-10: qty 0.5
  Filled 2024-09-10 (×2): qty 1
  Filled 2024-09-10: qty 0.5
  Filled 2024-09-10 (×2): qty 1
  Filled 2024-09-10: qty 0.5

## 2024-09-10 MED ORDER — ONDANSETRON HCL 4 MG/2ML IJ SOLN
4.0000 mg | Freq: Four times a day (QID) | INTRAMUSCULAR | Status: DC | PRN
  Administered 2024-09-10: 23:00:00 4 mg via INTRAVENOUS
  Filled 2024-09-10: qty 2

## 2024-09-10 MED ORDER — ROCURONIUM BROMIDE 10 MG/ML (PF) SYRINGE
PREFILLED_SYRINGE | INTRAVENOUS | Status: DC | PRN
  Administered 2024-09-10: 17:00:00 20 mg via INTRAVENOUS
  Administered 2024-09-10: 18:00:00 10 mg via INTRAVENOUS
  Administered 2024-09-10: 16:00:00 60 mg via INTRAVENOUS

## 2024-09-10 MED ORDER — ACETAMINOPHEN 325 MG PO TABS
650.0000 mg | ORAL_TABLET | Freq: Four times a day (QID) | ORAL | Status: DC | PRN
  Administered 2024-09-14: 650 mg via ORAL
  Filled 2024-09-10: qty 2

## 2024-09-10 MED ADMIN — Hydromorphone HCl Inj 1 MG/ML: 1 mg | INTRAVENOUS | @ 06:00:00 | NDC 76045000901

## 2024-09-10 MED FILL — henylephrine-NaCl Pref Syr 0.8 MG/10ML-0.9% (80 MCG/ML): INTRAVENOUS | Qty: 10 | Status: AC

## 2024-09-10 MED FILL — Rocuronium Bromide IV Soln Pref Syr 100 MG/10ML (10 MG/ML): INTRAVENOUS | Qty: 10 | Status: AC

## 2024-09-10 MED FILL — Sugammadex Sodium IV 200 MG/2ML (Base Equivalent): INTRAVENOUS | Qty: 2 | Status: AC

## 2024-09-10 MED FILL — Lidocaine HCl Local Soln Prefilled Syringe 100 MG/5ML (2%): INTRAMUSCULAR | Qty: 5 | Status: AC

## 2024-09-10 MED FILL — Hydromorphone HCl Inj 1 MG/ML: 1.0000 mg | INTRAMUSCULAR | Qty: 1 | Status: AC

## 2024-09-10 MED FILL — Ondansetron HCl Inj 4 MG/2ML (2 MG/ML): INTRAMUSCULAR | Qty: 2 | Status: AC

## 2024-09-10 SURGICAL SUPPLY — 83 items
BAG COUNTER SPONGE SURGICOUNT (BAG) ×6 IMPLANT
BENZOIN TINCTURE PRP APPL 2/3 (GAUZE/BANDAGES/DRESSINGS) ×3 IMPLANT
BLADE CLIPPER SURG (BLADE) ×3 IMPLANT
CANISTER SUCTION 3000ML PPV (SUCTIONS) ×6 IMPLANT
CATH BEACON 5.038 65CM KMP-01 (CATHETERS) ×3 IMPLANT
CATH OMNI FLUSH .035X70CM (CATHETERS) ×3 IMPLANT
CATH OMNI FLUSH 5F 65CM (CATHETERS) ×3 IMPLANT
CHLORAPREP W/TINT 26 (MISCELLANEOUS) ×6 IMPLANT
CLOSURE PERCLOSE PROSTYLE (Vascular Products) IMPLANT
COVER BACK TABLE 80X110 HD (DRAPES) ×6 IMPLANT
COVER PROBE W GEL 5X96 (DRAPES) ×3 IMPLANT
COVER SURGICAL LIGHT HANDLE (MISCELLANEOUS) ×3 IMPLANT
DERMABOND ADVANCED .7 DNX12 (GAUZE/BANDAGES/DRESSINGS) IMPLANT
DEVICE CLOSURE MYNXGRIP 6/7F (Vascular Products) IMPLANT
DEVICE TORQUE KENDALL .025-038 (MISCELLANEOUS) IMPLANT
DRAPE LAPAROSCOPIC ABDOMINAL (DRAPES) ×3 IMPLANT
DRAPE WARM FLUID 44X44 (DRAPES) ×3 IMPLANT
DRSG OPSITE POSTOP 4X10 (GAUZE/BANDAGES/DRESSINGS) IMPLANT
DRSG OPSITE POSTOP 4X8 (GAUZE/BANDAGES/DRESSINGS) IMPLANT
DRSG TEGADERM 2-3/8X2-3/4 SM (GAUZE/BANDAGES/DRESSINGS) ×6 IMPLANT
ELECT BLADE 6.5 EXT (BLADE) IMPLANT
ELECT CAUTERY BLADE 6.4 (BLADE) IMPLANT
ELECTRODE REM PT RTRN 9FT ADLT (ELECTROSURGICAL) ×6 IMPLANT
FILTER CO2 0.2 MICRON (VASCULAR PRODUCTS) IMPLANT
FILTER CO2 INSUFFLATOR AX1008 (MISCELLANEOUS) IMPLANT
GAUZE SPONGE 2X2 8PLY STRL LF (GAUZE/BANDAGES/DRESSINGS) ×6 IMPLANT
GLIDEWIRE ADV .035X260CM (WIRE) IMPLANT
GLOVE BIO SURGEON STRL SZ7 (GLOVE) ×3 IMPLANT
GLOVE BIO SURGEON STRL SZ8 (GLOVE) ×6 IMPLANT
GLOVE BIOGEL PI IND STRL 7.5 (GLOVE) ×3 IMPLANT
GOWN STRL REUS W/ TWL LRG LVL3 (GOWN DISPOSABLE) ×12 IMPLANT
GOWN STRL REUS W/ TWL XL LVL3 (GOWN DISPOSABLE) ×3 IMPLANT
GRAFT BALLN CATH 65CM (BALLOONS) ×3 IMPLANT
GUIDEWIRE ANGLED .035X150CM (WIRE) IMPLANT
HANDLE SUCTION POOLE (INSTRUMENTS) ×3 IMPLANT
KIT BASIN OR (CUSTOM PROCEDURE TRAY) ×6 IMPLANT
KIT ENCORE 26 ADVANTAGE (KITS) IMPLANT
KIT TURNOVER KIT B (KITS) ×6 IMPLANT
LIGASURE IMPACT 36 18CM CVD LR (INSTRUMENTS) IMPLANT
NDL PERC 18GX7CM (NEEDLE) ×3 IMPLANT
NEEDLE PERC 18GX7CM (NEEDLE) ×3 IMPLANT
PACK ENDO MINOR (CUSTOM PROCEDURE TRAY) ×3 IMPLANT
PACK ENDOVASCULAR (PACKS) ×3 IMPLANT
PACK GENERAL/GYN (CUSTOM PROCEDURE TRAY) ×3 IMPLANT
PAD ARMBOARD POSITIONER FOAM (MISCELLANEOUS) ×9 IMPLANT
PENCIL SMOKE EVACUATOR (MISCELLANEOUS) ×3 IMPLANT
SCISSORS LAP 5X35 DISP (ENDOMECHANICALS) IMPLANT
SET FLUSH CO2 (MISCELLANEOUS) IMPLANT
SET MICROPUNCTURE 5F STIFF (MISCELLANEOUS) ×3 IMPLANT
SET TUBE SMOKE EVAC HIGH FLOW (TUBING) IMPLANT
SHEATH BRITE TIP 8FR 23CM (SHEATH) ×3 IMPLANT
SHEATH FLEX ANSEL ANG 6F 45CM (SHEATH) IMPLANT
SHEATH PINNACLE 5F 10CM (SHEATH) IMPLANT
SHEATH PINNACLE 8F 10CM (SHEATH) ×3 IMPLANT
SHEATH PINNACLE R/O II 6F 4CM (SHEATH) IMPLANT
SLEEVE Z-THREAD 5X100MM (TROCAR) IMPLANT
SOLN 0.9% NACL POUR BTL 1000ML (IV SOLUTION) ×9 IMPLANT
SOLN STERILE WATER BTL 1000 ML (IV SOLUTION) IMPLANT
SPECIMEN JAR LARGE (MISCELLANEOUS) IMPLANT
SPONGE T-LAP 18X18 ~~LOC~~+RFID (SPONGE) IMPLANT
STAPLER SKIN PROX 35W (STAPLE) ×3 IMPLANT
STENT VIABAHN 6X15 6FR 135 (Permanent Stent) IMPLANT
STOPCOCK MORSE 400PSI 3WAY (MISCELLANEOUS) ×3 IMPLANT
STRIP CLOSURE SKIN 1/2X4 (GAUZE/BANDAGES/DRESSINGS) ×6 IMPLANT
SUT MNCRL AB 4-0 PS2 18 (SUTURE) ×3 IMPLANT
SUT PDS AB 1 TP1 96 (SUTURE) ×6 IMPLANT
SUT SILK 2 0 SH CR/8 (SUTURE) ×3 IMPLANT
SUT SILK 2-0 18XBRD TIE 12 (SUTURE) ×3 IMPLANT
SUT SILK 3 0 SH CR/8 (SUTURE) ×3 IMPLANT
SUT SILK 3-0 18XBRD TIE 12 (SUTURE) ×3 IMPLANT
SUT VIC AB 3-0 SH 8-18 (SUTURE) ×3 IMPLANT
SUT VICRYL 0 UR6 27IN ABS (SUTURE) IMPLANT
SYR MEDRAD MARK V 150ML (SYRINGE) IMPLANT
TOWEL GREEN STERILE (TOWEL DISPOSABLE) ×9 IMPLANT
TRAY FOLEY MTR SLVR 14FR STAT (SET/KITS/TRAYS/PACK) IMPLANT
TRAY FOLEY MTR SLVR 16FR STAT (SET/KITS/TRAYS/PACK) ×6 IMPLANT
TROCAR BALLN 12MMX100 BLUNT (TROCAR) IMPLANT
TROCAR Z-THREAD OPTICAL 5X100M (TROCAR) IMPLANT
TUBING HIGH PRESSURE 120CM (CONNECTOR) IMPLANT
TUBING INJECTOR 48 (MISCELLANEOUS) ×3 IMPLANT
WIRE BENTSON .035X145CM (WIRE) ×6 IMPLANT
WIRE LUNDERQUIST .035X180CM (WIRE) ×6 IMPLANT
YANKAUER SUCT BULB TIP NO VENT (SUCTIONS) IMPLANT

## 2024-09-10 NOTE — Op Note (Addendum)
 DATE OF SERVICE: 09/10/2024  PATIENT:  Sheryl Stone  68 y.o. female  PRE-OPERATIVE DIAGNOSIS:  superior mesenteric artery stenosis  POST-OPERATIVE DIAGNOSIS:  Same  PROCEDURE:   1) Ultrasound guided right common femoral artery access  2) Aortogram  3) Superior mesenteric artery selective angiogram  4) Superior mesenteric artery angioplasty and stenting (6x73mm VBX)  SURGEON:  Debby SAILOR. Magda, MD  ASSISTANT: Ahmed Holster, PA-C  ANESTHESIA:   general  ESTIMATED BLOOD LOSS: min  LOCAL MEDICATIONS USED:  LIDOCAINE    COUNTS: confirmed correct.  PATIENT DISPOSITION:  PACU - hemodynamically stable.   Delay start of Pharmacological VTE agent (>24hrs) due to surgical blood loss or risk of bleeding: no  INDICATION FOR PROCEDURE: Sheryl Stone is a 68 y.o. female with superior mesenteric artery stenosis. After careful discussion of risks, benefits, and alternatives the patient was offered angiogram with intervention. The patient understood and wished to proceed.  OPERATIVE FINDINGS:  Aortogram: Celiac artery widely patent SMA >70% proximal artery stenosis; reconfirmed on selective angiogram IMA not well seen Good result from SMA angioplasty and stenting with resolution of SMA stenosis  DESCRIPTION OF PROCEDURE: After identification of the patient in the pre-operative holding area, the patient was transferred to the operating room. The patient was positioned supine on the operating room table.  Anesthesia was induced. The groins was prepped and draped in standard fashion. A surgical pause was performed confirming correct patient, procedure, and operative location.  The right groin was anesthetized with subcutaneous injection of 1% lidocaine . Using ultrasound guidance, the right common femoral artery was accessed with micropuncture technique. The 32F micropuncture sheath was upsized to 68F.   A Benson wire was advanced into the distal aorta. Over the wire an omni flush  catheter was advanced to the level of diaphragm. Aortogram was performed - see above for details.   The SMA was selected with a glidewire advantage and omniflush catheter. A 4F x 45cm Ansel sheath was driven into the SMA. Selective angiogram was performed. A 6x60mm VBX was positioned across the lesion. The lesion was deployed in standard fashion. Follow up angiogram showed resolution of the stenosis.  All endovascular equipment was removed. A mynx device was used to close the arteriotomy. Hemostasis was excellent upon completion.   Case turned over to Dr. Teresa for diagnostic laparoscopy.  PLAN: ASA / Plavix  / Statin postop.  Debby SAILOR. Magda, MD [facility] Phone Number: [phone] 09/10/2024 5:21 PM

## 2024-09-10 NOTE — Hospital Course (Addendum)
 Sheryl Stone is a 68 y.o. female with PMH of CAD s/p stent in 2022, hypertension hyperlipidemia, anxiety disorder, type II diabetes mellitus, fibromyalgia presented to the ED 12/17 evening with acute abdominal pain severe in nature, with associated nausea, diarrhea and some abdominal distention. She endorses abdomen pain for many years at times worse with eating, also has fibromyalgia and gets leg pain on walking as well. She c/o sweating while in ambulance but no chest pain. Whever ehs gets frustrated or lifts something she gets chest pain too. Patient otherwise denies any chest pain, shortness of breath, fever, chills, headache, focal weakness, numbness tingling, speech difficulties.  Has mild pain now about 6-7/10 and has been 10 on admission and says it ha been 10 at home for several days In the ED at [facility]: CMP with mild hyponatremia mild transaminitis stable CBC COVID influenza flu negative troponin less than 15 elevated lactic acid 3.3 CXR no active disease.  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 Hemodynamically significant stenoses of the superior and inferior mesenteric artery origins. 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 No evidence of acute mesenteric ischemia. Peripheral vascular disease with hemodynamically significant lower extremity arterial inflow disease as outlined above. In the setting of gluteal claudication or lower extremity arterovascular insufficiency,  vascular consultation is recommended for further management. Patient received IV fluid hydration, heparin  drip Dr. Serene from vascular was consulted recommended [facility] evaluation, heparin  drip for anticoagulation, did not feel patient needed emergent surgical intervention. EDP discussed case with on-call PCCM Dr Layman who felt that patient was appropriate for admission to the Hospitalist service.  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 Magda 12/18 On DAPT, statin bowel regimen with senna/MiraLAX, monitor oral tolerance-given abdominal distention monitoring, increase ambulation, incentive she has decreased distention and having adequate flatus before discharge.  Potential discharge later today or tomorrow.  PVD with lower extremity arterial inflow disease: See CTA findings- [facility] is following currently on aspirin  Plavix    Hepatic steatosis/hepatomegaly: Monitor LFTs patient on statin po meds on hold-resume soon.  CAD w/p stent in 2022 Dr Rojelio at [facility] HTN H LD: Troponin unremarkable admission, endorses some chest pain on and off but with activity no chest pain and attributes her pain to fibromyalgia and anxiety. BP hypertensive on arrival. Last stress test in careeverywhere-02/14/22:No reversible ischemia or infarction.  Hypokinesis of the apex. Otherwise normal limiting ir wall  motion.Left ventricular ejection fraction 71%>  EKG on admit a nsr, abnormal R wave progression. Repeat troponin  negative on admission. Check FLP.  Resume statin in a.m., resume home Toprol  hold lisinopril  Type 2 diabetes mellitus: Cont to hold  home OHA- cont  q6 hr ssi.  Anxiety disorder Fibromyalgia: PTA on buspar , off xanax  recently, on tylenol  prn, gabapentin  and her regimen has been ineffective.  Resume home meds  DVT prophylaxis: SCD's Start: 09/10/24 2023SCD, Code Status:   Code Status: Full Code Family Communication: plan of care discussed with patient at bedside. Patient status is: Remains hospitalized because of severity of illness Level of care: Progressive   Dispo: The patient is from: HOME and husband at baseline.            Anticipated disposition: TBD Objective: Vitals last 24 hrs: Vitals:   09/10/24 1945 09/10/24 2016 09/10/24 2339 09/11/24 0303  BP: (!) 156/73 (!) 169/70 (!) 154/68 (!) 164/79  Pulse: 89  95 94  Resp: 19 (!) 21 19 19   Temp: 98.5 F (36.9 C) 98.3 F (36.8 C) 98.3 F (36.8 C) 98 F (36.7 C)  TempSrc:  Oral Oral Oral  SpO2: 91%  94% 96%  Weight:      Height:       Physical Examination: General exam: [id number] HEENT:Oral mucosa moist, Ear/Nose WNL grossly Respiratory system: Bilaterally clear BS,no use of accessory muscle Cardiovascular system: S1 & S2 +, No JVD. Gastrointestinal system: Abdomen soft,Tender on lower abdomen,ND, BS+ Nervous System: Alert, awake, moving all extremities,and following commands. Extremities: extremities cool but perfused Skin: Warm, no rashes MSK: Normal muscle bulk,tone, power   Medications reviewed:  Scheduled Meds:  aspirin  EC  81 mg Oral Daily   [START ON 09/12/2024] atorvastatin   80 mg Oral Daily   busPIRone   7.5 mg Oral TID   clopidogrel   75 mg Oral Q breakfast   ezetimibe   10 mg Oral Daily   gabapentin   100 mg Oral TID   insulin  aspart  0-9 Units Subcutaneous Q6H   metoprolol  succinate  25 mg Oral Daily   polyethylene glycol  17 g Oral Daily   sodium chloride  flush  3 mL Intravenous Q12H   Continuous  Infusions:  sodium chloride  150 mL/hr at 09/11/24 0329   sodium chloride      Diet: Diet Order             Diet regular Room service appropriate? Yes; Fluid consistency: Thin  Diet effective now

## 2024-09-10 NOTE — Anesthesia Procedure Notes (Addendum)
 Procedure Name: Intubation Date/Time: 09/10/2024 4:01 PM  Performed by: Harrold Macintosh, CRNAPre-anesthesia Checklist: Patient identified, Emergency Drugs available, Suction available and Patient being monitored Patient Re-evaluated:Patient Re-evaluated prior to induction Oxygen Delivery Method: Circle system utilized Preoxygenation: Pre-oxygenation with 100% oxygen Induction Type: IV induction Ventilation: Mask ventilation without difficulty and Oral airway inserted - appropriate to patient size Laryngoscope Size: Cleotilde and 2 Grade View: Grade I Tube type: Oral Number of attempts: 1 Airway Equipment and Method: Stylet and Oral airway Placement Confirmation: ETT inserted through vocal cords under direct vision, positive ETCO2 and breath sounds checked- equal and bilateral Secured at: 21 cm Tube secured with: Tape Dental Injury: Teeth and Oropharynx as per pre-operative assessment

## 2024-09-10 NOTE — Consult Note (Signed)
 Sheryl Stone 1956-02-27  [id number].    Requesting MD: Dr. Mennie Lamy Chief Complaint/Reason for Consult: Abdominal pain  HPI: Sheryl Stone is a 68 y.o.  female with a history of CAD, HTN, HLD, DM2, and 30-pack-year history who presented with what abdominal pain. Patient reports years of abdominal pain after eating with intermittent nausea. She reports over the last 3-4 days she began having progressively worst abdominal pain that is now crampy and severe.  She reports subjective fevers, chills, diaphoresis, and nausea.  She continues pass flatus.  Last BM yesterday without hematochezia or melena.  She thinks her last colonoscopy was within the last 5 years at [facility] and normal - I do not see these results.  She is not on blood thinners at baseline.  She reports prior laparoscopic cholecystectomy and lap partial hysterectomy. No alcohol or drug use. Retired. Her husband is at bedside.   ROS: ROS As above, see hpi  History reviewed. No pertinent family history.  Past Medical History:  Diagnosis Date   Anxiety and depression    Back pain    Fibromyalgia    Hypertension    Panic attacks     Past Surgical History:  Procedure Laterality Date   PARTIAL HYSTERECTOMY      Social History:  reports that she has quit smoking. She has never used smokeless tobacco. She reports that she does not drink alcohol and does not use drugs.  Allergies: Allergies[1]  Medications Prior to Admission  Medication Sig Dispense Refill   ALPRAZolam  (XANAX ) 0.5 MG tablet Take 1 tablet (0.5 mg total) by mouth 3 (three) times daily as needed for anxiety. 90 tablet 0   aspirin  81 MG tablet Take 81 mg by mouth daily.     atorvastatin  (LIPITOR) 80 MG tablet Take 80 mg by mouth daily.     busPIRone  (BUSPAR ) 5 MG tablet TAKE 1 & 1/2 (ONE & ONE-HALF) TABLETS BY MOUTH TWICE DAILY 90 tablet 1   Cholecalciferol ([employer] VIT D 5000 HIGH-POTENCY PO) Take by mouth.     dicyclomine  (BENTYL ) 20 MG tablet  Take 1 tablet by mouth twice daily 60 tablet 2   ezetimibe  (ZETIA ) 10 MG tablet Take 1 tablet (10 mg total) by mouth daily. 90 tablet 0   fluconazole  (DIFLUCAN ) 150 MG tablet Take 1 tablet (150 mg total) by mouth daily. May repeat in 3 days if needed. 2 tablet 0   gabapentin  (NEURONTIN ) 100 MG capsule Take 1 capsule (100 mg total) by mouth 3 (three) times daily. 90 capsule 3   lisinopril (ZESTRIL) 5 MG tablet Take 5 mg by mouth daily.     metFORMIN  (GLUCOPHAGE -XR) 500 MG 24 hr tablet Take 2 tablets (1,000 mg total) by mouth daily with breakfast. 180 tablet 1   metoprolol  succinate (TOPROL -XL) 25 MG 24 hr tablet Take 1 tablet (25 mg total) by mouth daily. Take with or immediately following a meal 90 tablet 1   Multiple Vitamin (MULTIVITAMIN) tablet Take 1 tablet by mouth daily.     ondansetron  (ZOFRAN -ODT) 4 MG disintegrating tablet Take 1 tablet (4 mg total) by mouth every 8 (eight) hours as needed for nausea or vomiting. 20 tablet 0   pantoprazole  (PROTONIX ) 20 MG tablet Take 2 tablets (40 mg total) by mouth daily. 180 tablet 0   senna-docusate (SENOKOT-S) 8.6-50 MG tablet Take 1 tablet by mouth at bedtime. 30 tablet 0     Physical Exam: Blood pressure 112/87, pulse 92, temperature 98 F (36.7 C),  temperature source Oral, resp. rate (!) 22, height 5' 3 (1.6 m), weight 68 kg, SpO2 99%. General: pleasant, WD/WN female who is laying in bed in NAD HEENT: head is normocephalic, atraumatic.  Sclera are non-icteric. Ears and nose without any obvious masses or lesions.  Mouth is pink and moist. Dentition fair Heart: regular, rate, and rhythm.   Lungs: CTAB, no wheezes, rhonchi, or rales noted.  Respiratory effort nonlabored. On o2 Abd: Soft, mild distension, generalized ttp greatest in the suprapubic abdomen. No masses, hernias, or organomegaly MS: no BUE or BLE edema Skin: warm and dry  Psych: A&Ox4 with an appropriate affect Neuro: normal speech, thought process intact, moves all extremities, gait  not assessed   Results for orders placed or performed during the hospital encounter of 09/09/24 (from the past 48 hours)  Urinalysis, Routine w reflex microscopic -Urine, Clean Catch     Status: Abnormal   Collection Time: 09/09/24  2:18 PM  Result Value Ref Range   Color, Urine YELLOW YELLOW   APPearance CLEAR CLEAR   Specific Gravity, Urine 1.015 1.005 - 1.030   pH 7.0 5.0 - 8.0   Glucose, UA NEGATIVE NEGATIVE mg/dL   Hgb urine dipstick NEGATIVE NEGATIVE   Bilirubin Urine NEGATIVE NEGATIVE   Ketones, ur NEGATIVE NEGATIVE mg/dL   Protein, ur NEGATIVE NEGATIVE mg/dL   Nitrite NEGATIVE NEGATIVE   Leukocytes,Ua TRACE (A) NEGATIVE    Comment: Performed at [facility] [address] [place], [place] [address]  Urinalysis, Microscopic (reflex)     Status: Abnormal   Collection Time: 09/09/24  2:18 PM  Result Value Ref Range   RBC / HPF 0-5 0 - 5 RBC/hpf   WBC, UA 6-10 0 - 5 WBC/hpf   Bacteria, UA FEW (A) NONE SEEN   Squamous Epithelial / HPF 11-20 0 - 5 /HPF   Non Squamous Epithelial PRESENT (A) NONE SEEN    Comment: Performed at [facility], [address] [place], [place] [address]  Troponin T, High Sensitivity     Status: None   Collection Time: 09/09/24  2:45 PM  Result Value Ref Range   Troponin T High Sensitivity <15 0 - 19 ng/L    Comment: (NOTE) Biotin concentrations > 1000 ng/mL falsely decrease TnT results.  Serial cardiac troponin measurements are suggested.  Refer to the Links section for chest pain algorithms and additional  guidance. Performed at [facility], [address] [place], [place] [address]   Lipase, blood     Status: None   Collection Time: 09/09/24  3:43 PM  Result Value Ref Range   Lipase 32 11 - 51 U/L    Comment: Performed at [facility], [address] [place], [place] [address]  Comprehensive metabolic panel     Status: Abnormal   Collection Time: 09/09/24  3:43 PM  Result Value Ref Range    Sodium 132 (L) 135 - 145 mmol/L   Potassium 4.3 3.5 - 5.1 mmol/L    Comment: HEMOLYSIS AT THIS LEVEL MAY AFFECT RESULT   Chloride 92 (L) 98 - 111 mmol/L   CO2 25 22 - 32 mmol/L   Glucose, Bld 95 70 - 99 mg/dL    Comment: Glucose reference range applies only to samples taken after fasting for at least 8 hours.   BUN 8 8 - 23 mg/dL   Creatinine, Ser 9.26 0.44 - 1.00 mg/dL   Calcium  10.0 8.9 - 10.3  mg/dL   Total Protein 8.1 6.5 - 8.1 g/dL   Albumin  4.9 3.5 - 5.0 g/dL   AST 48 (H) 15 - 41 U/L    Comment: HEMOLYSIS AT THIS LEVEL MAY AFFECT RESULT   ALT 54 (H) 0 - 44 U/L   Alkaline Phosphatase 149 (H) 38 - 126 U/L   Total Bilirubin 0.4 0.0 - 1.2 mg/dL   GFR, Estimated >39 >39 mL/min    Comment: (NOTE) Calculated using the CKD-EPI Creatinine Equation (2021)    Anion gap 15 5 - 15    Comment: Performed at [facility], [address] [place], [place] [address]  Resp panel by RT-PCR (RSV, Flu A&B, Covid) Anterior Nasal Swab     Status: None   Collection Time: 09/09/24  3:43 PM   Specimen: Anterior Nasal Swab  Result Value Ref Range   SARS Coronavirus 2 by RT PCR NEGATIVE NEGATIVE    Comment: (NOTE) SARS-CoV-2 target nucleic acids are NOT DETECTED.  The SARS-CoV-2 RNA is generally detectable in upper respiratory specimens during the acute phase of infection. The lowest concentration of SARS-CoV-2 viral copies this assay can detect is 138 copies/mL. A negative result does not preclude SARS-Cov-2 infection and should not be used as the sole basis for treatment or other patient management decisions. A negative result may occur with  improper specimen collection/handling, submission of specimen other than nasopharyngeal swab, presence of viral mutation(s) within the areas targeted by this assay, and inadequate number of viral copies(<138 copies/mL). A negative result must be combined with clinical observations, patient history, and epidemiological information. The expected  result is Negative.  Fact Sheet for Patients:  bloggercourse.com  Fact Sheet for Healthcare Providers:  seriousbroker.it  This test is no t yet approved or cleared by the [place]  FDA and  has been authorized for detection and/or diagnosis of SARS-CoV-2 by FDA under an Emergency Use Authorization (EUA). This EUA will remain  in effect (meaning this test can be used) for the duration of the COVID-19 declaration under Section 564(b)(1) of the Act, 21 U.S.C.section 360bbb-3(b)(1), unless the authorization is terminated  or revoked sooner.       Influenza A by PCR NEGATIVE NEGATIVE   Influenza B by PCR NEGATIVE NEGATIVE    Comment: (NOTE) The Xpert Xpress SARS-CoV-2/FLU/RSV plus assay is intended as an aid in the diagnosis of influenza from Nasopharyngeal swab specimens and should not be used as a sole basis for treatment. Nasal washings and aspirates are unacceptable for Xpert Xpress SARS-CoV-2/FLU/RSV testing.  Fact Sheet for Patients: bloggercourse.com  Fact Sheet for Healthcare Providers: seriousbroker.it  This test is not yet approved or cleared by the [place]  FDA and has been authorized for detection and/or diagnosis of SARS-CoV-2 by FDA under an Emergency Use Authorization (EUA). This EUA will remain in effect (meaning this test can be used) for the duration of the COVID-19 declaration under Section 564(b)(1) of the Act, 21 U.S.C. section 360bbb-3(b)(1), unless the authorization is terminated or revoked.     Resp Syncytial Virus by PCR NEGATIVE NEGATIVE    Comment: (NOTE) Fact Sheet for Patients: bloggercourse.com  Fact Sheet for Healthcare Providers: seriousbroker.it  This test is not yet approved or cleared by the [place]  FDA and has been authorized for detection and/or diagnosis of SARS-CoV-2  by FDA under an Emergency Use Authorization (EUA). This EUA will remain in effect (meaning this test can be used) for the duration of the COVID-19 declaration under Section 564(b)(1) of  the Act, 21 U.S.C. section 360bbb-3(b)(1), unless the authorization is terminated or revoked.  Performed at [facility], [address] [place], [place] [address]   CBC     Status: None   Collection Time: 09/09/24  4:28 PM  Result Value Ref Range   WBC 8.2 4.0 - 10.5 K/uL   RBC 4.14 3.87 - 5.11 MIL/uL   Hemoglobin 13.3 12.0 - 15.0 g/dL   HCT 61.1 63.9 - 53.9 %   MCV 93.7 80.0 - 100.0 fL   MCH 32.1 26.0 - 34.0 pg   MCHC 34.3 30.0 - 36.0 g/dL   RDW 86.7 88.4 - 84.4 %   Platelets 373 150 - 400 K/uL   nRBC 0.0 0.0 - 0.2 %    Comment: Performed at [facility], [address] [place], [place] [address]  Lactic acid, plasma     Status: Abnormal   Collection Time: 09/09/24  6:29 PM  Result Value Ref Range   Lactic Acid, Venous 3.3 (HH) 0.5 - 1.9 mmol/L    Comment: CRITICAL RESULT CALLED TO, READ BACK BY AND VERIFIED WITH: Performed at [facility], [address] [place], [place] [address]   Lactic acid, plasma     Status: Abnormal   Collection Time: 09/10/24 12:00 AM  Result Value Ref Range   Lactic Acid, Venous 2.8 (HH) 0.5 - 1.9 mmol/L    Comment: Critical value noted. Value is consistent with previously reported and called value  Performed at [facility], [address] [place], [place] [address]   Lactic acid, plasma     Status: Abnormal   Collection Time: 09/10/24  6:06 AM  Result Value Ref Range   Lactic Acid, Venous 2.4 (HH) 0.5 - 1.9 mmol/L    Comment: Critical value noted. Value is consistent with previously reported and called value  Performed at [facility], [address] [place], [place] [address]   Lactic acid, plasma     Status: Abnormal   Collection Time: 09/10/24  9:44 AM  Result Value Ref Range   Lactic Acid,  Venous 2.7 (HH) 0.5 - 1.9 mmol/L    Comment: Critical Value, Read Back and verified with Sheryl Stone [id number] AT 1125 BY [facility] Performed at [facility], [address] [place], [place] [address]   CBG monitoring, ED     Status: Abnormal   Collection Time: 09/10/24 10:00 AM  Result Value Ref Range   Glucose-Capillary 145 (H) 70 - 99 mg/dL    Comment: Glucose reference range applies only to samples taken after fasting for at least 8 hours.  Glucose, capillary     Status: Abnormal   Collection Time: 09/10/24 12:05 PM  Result Value Ref Range   Glucose-Capillary 154 (H) 70 - 99 mg/dL    Comment: Glucose reference range applies only to samples taken after fasting for at least 8 hours.   Comment 1 Notify RN    Comment 2 Document in Chart   Heparin  level (unfractionated)     Status: Abnormal   Collection Time: 09/10/24 12:09 PM  Result Value Ref Range   Heparin  Unfractionated 0.92 (H) 0.30 - 0.70 IU/mL    Comment: (NOTE) The clinical reportable range upper limit is being lowered to >1.10 to align with the FDA approved guidance for the current laboratory assay.  If heparin  results are below expected values, and patient dosage has  been confirmed, suggest  follow up testing of antithrombin III levels. Performed at [facility], [address], [place], [place] [address]    CT Angio Abd/Pel W and/or Wo Contrast Result Date: 09/09/2024 EXAM: CTA ABDOMEN AND PELVIS WITH AND WITHOUT CONTRAST 09/09/2024 09:02:02 PM TECHNIQUE: CTA images of the abdomen and pelvis with and without intravenous contrast. Three-dimensional MIP/volume rendered formations were performed. Automated exposure control, iterative reconstruction, and/or weight based adjustment of the mA/kV was utilized to reduce the radiation dose to as low as reasonably achievable. COMPARISON: Prior examination of 5:49 pm. CLINICAL HISTORY: severe abdominal pain. Mesenteric ischemia. FINDINGS: VASCULATURE: AORTA: Extensive  aortoiliac atherosclerotic calcification. No aortic aneurysm or dissection. No hemodynamically significant stenosis. CELIAC TRUNK: There is some 50% stenosis of the celiac axis origin. Normal anatomic configuration. Note is made of an accessory left hepatic artery. No aneurysm or dissection. SUPERIOR MESENTERIC ARTERY: 75% stenosis of the superior mesenteric artery at its origin. Distally widely patent without aneurysmal dissection. RENAL ARTERIES: Single renal arteries bilaterally are widely patent and demonstrate normal vascular morphology. No aneurysm or dissection. ILIAC ARTERIES: Multifocal 50% stenosis of the proximal right common iliac artery. Moderate calcific plaque within the right external iliac artery without hemodynamically significant stenosis. Right internal iliac artery is diseased with patent its origin. Focal 75% stenosis of the left common iliac artery secondary to calcified atherosclerotic plaque. Less than 50% stenosis of the left external iliac artery secondary to calcified atherosclerotic plaque. New occlusion of the left internal iliac artery at its origin secondary to calcified plaque. LIVER: Moderate hepatic steatosis. Mild hepatomegaly. Correlation with liver enzymes may be helpful to exclude an obstructive or inflammatory process. GALLBLADDER AND BILE DUCTS: Status post cholecystectomy. No biliary ductal dilatation. SPLEEN: The spleen is unremarkable. PANCREAS: The pancreas is unremarkable. ADRENAL GLANDS: Bilateral adrenal glands demonstrate no acute abnormality. KIDNEYS, URETERS AND BLADDER: No stones in the kidneys or ureters. No hydronephrosis. No perinephric or periureteral stranding. Urinary bladder is unremarkable. 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 Distally patent. Moderate sigmoid diverticulosis without superimposed focal inflammatory change. No evidence of regional bowel ischemia. The stomach, small bowel, and  large bowel are otherwise unremarkable. Appendix absent. REPRODUCTIVE: Status post hysterectomy. Complex mixed solid and cystic 4.5 cm right ovarian mass again identified. Gynecologic consultation is recommended for further management, as noted previously, given its interval increase in size since remote prior examination of September 23, 2017. PERITONEUM AND RETROPERITONEUM: No ascites or free air. LUNG BASE: No acute abnormality. LYMPH NODES: No lymphadenopathy. BONES AND SOFT TISSUES: No acute abnormality of the bones. No acute soft tissue abnormality. RAF SCORE: Aortic atherosclerosis (ICD10-I70.0) IMPRESSION: 1. 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 2. Hemodynamically significant stenoses of the superior and inferior mesenteric artery origins. 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 No evidence of acute mesenteric ischemia. 3. Peripheral vascular disease with hemodynamically significant lower extremity arterial inflow disease as outlined above. In the setting of gluteal claudication or lower extremity arterovascular insufficiency, vascular consultation is recommended for further management. 4. Moderate hepatic steatosis and mild hepatomegaly. 5. 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 Gynecologic consultation is recommended for further evaluation. Electronically signed by: Dorethia Molt MD 09/09/2024 09:19  PM EST RP Workstation: [id number]   CT ABDOMEN PELVIS W CONTRAST Result Date: 09/09/2024 EXAM: CT ABDOMEN AND PELVIS WITH CONTRAST 09/09/2024 06:11:50 PM TECHNIQUE: CT of the abdomen and pelvis was performed with the administration of 100 mL of iohexol  (OMNIPAQUE )  300 MG/ML solution. Multiplanar reformatted images are provided for review. Automated exposure control, iterative reconstruction, and/or weight-based adjustment of the mA/kV was utilized to reduce the radiation dose to as low as reasonably achievable. COMPARISON: Prior examinations of 12/19/2023 and 09/23/2017. CLINICAL HISTORY: Abdominal pain, acute, nonlocalized. FINDINGS: LOWER CHEST: No acute abnormality. LIVER: Moderate hepatic steatosis and hepatomegaly. GALLBLADDER AND BILE DUCTS: Status post cholecystectomy. Mild dilation of the biliary tree likely reflects post cholecystectomy change. Correlation with liver enzymes may be helpful to exclude an obstructive or inflammatory process. SPLEEN: No acute abnormality. PANCREAS: No acute abnormality. ADRENAL GLANDS: No acute abnormality. KIDNEYS, URETERS AND BLADDER: No stones in the kidneys or ureters. No hydronephrosis. No perinephric or periureteral stranding. Urinary bladder is unremarkable. Particularly prominent atherosclerotic calcifications seen at the origin of the right renal artery. If there is clinical evidence of hemodynamically significant renal artery stenosis, CT arteriography may be more helpful for further evaluation. GI AND BOWEL: Moderate sigmoid diverticulosis. Appendix absent. The stomach, small bowel, and large bowel are otherwise unremarkable. There is no bowel obstruction. PERITONEUM AND RETROPERITONEUM: No ascites. No free air. VASCULATURE: Extensive aortoiliac atherosclerotic calcifications. Particularly prominent atherosclerotic calcifications seen at the origin of the mesenteric and bilateral lower extremity arterial inflow. Peripheral vascular disease. If there is clinical evidence of chronic mesenteric ischemia or lower extremity claudication, CT arteriography may be more helpful for further evaluation. LYMPH NODES: No lymphadenopathy. REPRODUCTIVE ORGANS: Uterus is absent. Residual left ovaries unremarkable. 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 This mass measures 4.2 x 4.5 x 3.3 cm. Volume calculated as 32.7 cm. Gynecologic consultation is recommended for further evaluation. BONES AND SOFT TISSUES: No acute osseous abnormality. No focal soft tissue abnormality. RAF SCORE: Aortic atherosclerosis (ICD10-I70.0) IMPRESSION: 1. 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 2. Moderate hepatic steatosis and hepatomegaly. 3. Mild dilation of the biliary tree likely reflects post cholecystectomy change, and correlation with liver enzymes may be helpful to exclude an obstructive or inflammatory process. 4. Peripheral vascular disease, and if there is clinical evidence of hemodynamically significant renal artery stenosis, chronic mesenteric ischemia, or lower extremity claudication, CT arteriography may be more helpful for further evaluation. 5. Moderate sigmoid diverticulosis without evidence of diverticulitis. 6. RAF score includes Aortic atherosclerosis (ICD10-I70.0). Electronically signed by: Dorethia Molt MD 09/09/2024 06:26 PM EST RP Workstation: [id number]   DG Chest Portable 1 View Result Date: 09/09/2024 CLINICAL DATA:  Shortness of breath and chest pain. EXAM: PORTABLE CHEST 1 VIEW COMPARISON:  None Available. FINDINGS: No focal consolidation, pleural effusion, or pneumothorax. Background of emphysema. Mild cardiomegaly. Atherosclerotic calcification of the aorta. No acute osseous pathology. IMPRESSION: 1. No active disease. 2. Mild cardiomegaly. Electronically Signed   By: Vanetta Chou M.D.   On: 09/09/2024 15:18    Anti-infectives (From admission, onward)    Start     Dose/Rate Route Frequency Ordered  Stop   09/10/24 0045  cefTRIAXone  (ROCEPHIN ) 2 g in sodium chloride  0.9 % 100 mL IVPB        2 g 200 mL/hr over 30 Minutes Intravenous  Once 09/10/24 0032 09/10/24 0140   09/10/24 0045  metroNIDAZOLE  (FLAGYL ) IVPB 500 mg        500 mg 100 mL/hr over 60 Minutes Intravenous  Once 09/10/24 0032 09/10/24 0305       Assessment/Plan This is a 68 year old female with a history of CAD, HTN, HLD, DM2, and 30-pack-year history who presented with what sounds like years of chronic mesenteric ischemia who's symptoms began acutely worsening over the last 3-4 days and was found to have 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  No obvious free air, free fluid, pneumatosis or portal venous gas.  She is currently hemodynamically stable without fever, tachycardia or hypotension.  She is not on pressors.  WBC 8.2.  Lactic acid is elevated but slightly downtrending at 2.7 from 3.3.  No AKI.  She does have mild elevation of LFTs (Alk Phos 149, AST 48, ALT 54, T. Bili wnl) but no shock liver.  On exam she is without peritonitis but I think she would still benefit from a diagnostic laparoscopy to look at things given her acute onset of worsening pain, elevated lactic and degree of tenderness to ensure no acute mesenteric ischemia.  Vascular was seen today and planning aortogram with intervention today in the OR.  Plan for diagnostic laparoscopy, possible exploratory laparotomy, possible bowel resection. The planned procedure and material risks were discussed with the patient. Risks include but are not limited to anesthesia (MI, CVA, prolonged intubation, aspiration, death), pain, bleeding, infection, scarring, hernia, damage to surrounding structures (blood vessels/nerves/viscus/organs/ureter), ileus, leak from anastomosis, DVT/PE and possible need for  stoma/ostomy. We also discussed the possibility of her remaining intubated postoperatively, the possibility of her abdomen are remaining open postop and needing another look in the OR.  The patient's questions were answered to their satisfaction, they voiced understanding and elected to proceed with surgery.   FEN - NPO, IVF per [facility] VTE - SCDs, heparin  gtt (please hold 1.5 hours before OR) ID - Rocephin /Flagyl   Foley - None currently Dispo - Admit to [facility].   I reviewed nursing notes, Consultant (Vascular) notes, hospitalist notes, last 24 h vitals and pain scores, last 48 h intake and output, last 24 h labs and trends, and last 24 h imaging results  Ozell CHRISTELLA Shaper, PA-C [facility] 09/10/2024, 1:36 PM Please see Amion for pager number during day hours 7:00am-4:30pm     [1]  Allergies Allergen Reactions   Latex Itching   Codeine Other (See Comments)    Unknown   Dextromethorphan Other (See Comments)    hyper   Diazepam Other (See Comments)    Unknown   Diphenhydramine  Hcl Other (See Comments)     Unknown   Doxycycline Other (See Comments)    Unknown   Elemental Sulfur Other (See Comments)    Unknown    Moxifloxacin Diarrhea   Nitrofurantoin Other (See Comments)     Unknown   Penicillins Other (See Comments)    All Cillins     Propoxyphene Other (See Comments)    Unknown   Pseudoephedrine Other (See Comments)     excess energy   Sulfa Antibiotics Other (See Comments)    Unknown    Sulfamethoxazole Other (See Comments)    Unknown   Sulfur  Other (See Comments)    Unknown    Tetracycline Hcl Other (See Comments)     Unknown   Tramadol Other (See Comments)    Hyper sedation    Morphine Dermatitis

## 2024-09-10 NOTE — Progress Notes (Signed)
°   09/10/24 1131  Vitals  Temp 98 F (36.7 C)  Temp Source Oral  BP 112/87  MAP (mmHg) 95  BP Location Left Arm  BP Method Automatic  Patient Position (if appropriate) Lying  Pulse Rate 92  ECG Heart Rate 92  Resp (!) 22  MEWS COLOR  MEWS Score Color Green  Oxygen Therapy  SpO2 99 %  O2 Device Nasal Cannula  O2 Flow Rate (L/min) 2 L/min  MEWS Score  MEWS Temp 0  MEWS Systolic 0  MEWS Pulse 0  MEWS RR 1  MEWS LOC 0  MEWS Score 1   Patient arrived from [facility] to 4E17 vital signs obatained and ccmd made aware patient on telemetry box mc 40-17. Patient oriented to room bed side RN in room at bedside. Meryem Haertel Jessup RN

## 2024-09-10 NOTE — Anesthesia Preprocedure Evaluation (Addendum)
 Anesthesia Evaluation  Patient identified by MRN, date of birth, ID band Patient awake    Reviewed: Allergy & Precautions, NPO status , Patient's Chart, lab work & pertinent test results  Airway Mallampati: III  TM Distance: >3 FB Neck ROM: Full    Dental  (+) Edentulous Lower, Edentulous Upper   Pulmonary former smoker   Pulmonary exam normal        Cardiovascular hypertension, Pt. on home beta blockers and Pt. on medications + CAD, + Past MI and + Cardiac Stents (x 1 in 2022)  Normal cardiovascular exam  stenosis of the SMA and IMA   Neuro/Psych  PSYCHIATRIC DISORDERS Anxiety Depression       GI/Hepatic Neg liver ROS,GERD  Medicated and Controlled,,  Endo/Other  diabetes, Oral Hypoglycemic Agents    Renal/GU negative Renal ROS     Musculoskeletal  (+)  Fibromyalgia -  Abdominal   Peds  Hematology negative hematology ROS (+)   Anesthesia Other Findings PAIN IN UPPER ABDOMEN mesenteric ischemia  Reproductive/Obstetrics                              Anesthesia Physical Anesthesia Plan  ASA: 4 and emergent  Anesthesia Plan: General   Post-op Pain Management:    Induction: Intravenous  PONV Risk Score and Plan: 3 and Ondansetron , Dexamethasone  and Treatment may vary due to age or medical condition  Airway Management Planned: Oral ETT  Additional Equipment: Arterial line  Intra-op Plan:   Post-operative Plan: Possible Post-op intubation/ventilation  Informed Consent: I have reviewed the patients History and Physical, chart, labs and discussed the procedure including the risks, benefits and alternatives for the proposed anesthesia with the patient or authorized representative who has indicated his/her understanding and acceptance.       Plan Discussed with: CRNA  Anesthesia Plan Comments:          Anesthesia Quick Evaluation

## 2024-09-10 NOTE — Progress Notes (Signed)
 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   Medical History: Past Medical History:  Diagnosis Date   Anxiety and depression    Back pain    Fibromyalgia    Hypertension    Panic attacks     Medications:  Scheduled:   insulin  aspart  0-9 Units Subcutaneous Q6H   Infusions:   sodium chloride      heparin  1,100 Units/hr (09/10/24 0114)    Assessment: 68yo female c/o abdominal pain associated with nausea and diarrhea, imaging concerning for mesenteric ischemia.  Patient was started on heparin  prior to transfer to [facility].  Heparin  level above goal on 1100 units/hr.  Will decrease rate.  Noted plans for surgery this afternoon.   Goal of Therapy:  Heparin  level 0.3-0.7 units/ml Monitor platelets by anticoagulation protocol: Yes   Plan:  Decrease heparin  to 950 units/hr Follow-up after procedure this PM  [employer], Pharm.D., BCPS Clinical Pharmacist Clinical phone for 09/10/2024 from 7:30-3:00 is [phone].  **Pharmacist phone directory can be found on [email or web address] listed under [facility].  09/10/2024 1:15 PM       [1]  Allergies Allergen Reactions   Latex Itching   Codeine Other (See Comments)    Unknown   Dextromethorphan Other (See Comments)    hyper   Diazepam Other (See Comments)    Unknown    Diphenhydramine  Hcl Other (See Comments)     Unknown   Doxycycline Other (See Comments)    Unknown   Elemental Sulfur Other (See Comments)    Unknown    Moxifloxacin Diarrhea   Nitrofurantoin Other (See Comments)     Unknown   Penicillins Other (See Comments)    All Cillins     Propoxyphene Other (See Comments)    Unknown   Pseudoephedrine Other (See Comments)     excess energy   Sulfa Antibiotics Other (See Comments)    Unknown    Sulfamethoxazole Other (See Comments)    Unknown   Sulfur Other (See Comments)    Unknown    Tetracycline Hcl Other (See Comments)     Unknown   Tramadol Other (See Comments)    Hyper sedation    Morphine Dermatitis

## 2024-09-10 NOTE — Anesthesia Procedure Notes (Addendum)
 Arterial Line Insertion Start/End12/18/2025 2:00 PM, 09/10/2024 2:05 PM Performed by: Zelphia Norleen HERO, CRNA, CRNA  Patient location: Pre-op. Preanesthetic checklist: patient identified, IV checked, site marked, risks and benefits discussed, surgical consent, monitors and equipment checked, pre-op evaluation, timeout performed and anesthesia consent Lidocaine  1% used for infiltration Left, radial was placed Catheter size: 20 G Hand hygiene performed  and maximum sterile barriers used  Allen's test indicative of satisfactory collateral circulation Attempts: 1 Following insertion, Biopatch and dressing applied. Post procedure assessment: normal and unchanged  Patient tolerated the procedure well with no immediate complications.

## 2024-09-10 NOTE — Progress Notes (Signed)
 Hospitalist Transfer Note:    Nursing staff, Please call [facility] Admits & Consults System-Wide number on Amion [phone]) as soon as patient's arrival, so appropriate admitting provider can evaluate the pt.  Transferring facility: [facility] Requesting provider: Dr. Theadore (EDP at [facility]) Reason for transfer: admission for further evaluation and management of abdominal pain with concern for potential mesenteric ischemia.     68 year old female with history of diabetes, essential hypertension, atherosclerotic disease, who presented to [facility] ED complaining of 1 day of abdominal discomfort.   Vital signs in the ED were notable for the following: Afebrile.   Imaging notable for CTA of the abdomen/pelvis with and without contrast, which was reported to be suggestive of potential mesenteric ischemia.   EDP d/w on-call vascular surgery, Dr. Serene, who recommended admission to [facility] where vascular surgery will formally consult.  Dr. Serene did not feel that the patient needed emergent surgical intervention, but recommended initiation of heparin  drip as well as for EDP to discuss case with on-call PCCM; EDP subsequently discussed with on-call PCCM, who felt that patient was appropriate for admission to the Hospitalist service.   Medications administered prior to transfer included the following: Heparin  drip as well as Rocephin /IV Flagyl .  Subsequently, I accepted this patient for transfer for inpatient admission to a pcu bed at [facility] for further work-up and management of the above.       Eva Pore, DO Hospitalist

## 2024-09-10 NOTE — Transfer of Care (Signed)
 Immediate Anesthesia Transfer of Care Note  Patient: [facility]  Procedure(s) Performed: AORTOGRAM INSERTION, ENDOVASCULAR STENT GRAFT, SUPERIOR MESENTERIC ARTERY ULTRASOUND GUIDANCE, FOR VASCULAR ACCESS (Groin) LAPAROSCOPY, DIAGNOSTIC WITH LYSIS OF ADHESIONS (Abdomen)  Patient Location: PACU  Anesthesia Type:General  Level of Consciousness: awake, alert , and oriented  Airway & Oxygen Therapy: Patient Spontanous Breathing and Patient connected to face mask oxygen  Post-op Assessment: Report given to RN, Post -op Vital signs reviewed and stable, Patient moving all extremities X 4, and Patient able to stick tongue midline  Post vital signs: Reviewed and stable  Last Vitals:  Vitals Value Taken Time  BP 168/56 09/10/24 18:30  Temp 36.9 C 09/10/24 18:30  Pulse 86 09/10/24 18:35  Resp 16 09/10/24 18:35  SpO2 92 % 09/10/24 18:35  Vitals shown include unfiled device data.  Last Pain:  Vitals:   09/10/24 1357  TempSrc:   PainSc: 5          Complications: No notable events documented.

## 2024-09-10 NOTE — Progress Notes (Signed)
 [facility] night cross cover note:   I was notified by the patient's RN at the patient's request for resumption of home Xanax  at this time.  I subsequently ordered xanax  0.5 mg po qdaily prn for anxiety.     Eva Pore, DO Hospitalist

## 2024-09-10 NOTE — ED Provider Notes (Signed)
°  Provider Note MRN:  [id number]  Arrival date & time: 09/10/2024    ED Course and Medical Decision Making  Assumed care of patient at sign-out or upon transfer.  Concern for mesenteric ischemia patient is on heparin , plan is for progressive bed admission.  Providing antibiotics, pain control as well.  .Critical Care  Performed by: Theadore Ozell HERO, MD Authorized by: Theadore Ozell HERO, MD   Critical care provider statement:    Critical care time (minutes):  35   Critical care was necessary to treat or prevent imminent or life-threatening deterioration of the following conditions: Concern for mesenteric ischemia.   Critical care was time spent personally by me on the following activities:  Development of treatment plan with patient or surrogate, discussions with consultants, evaluation of patient's response to treatment, examination of patient, ordering and review of laboratory studies, ordering and review of radiographic studies, ordering and performing treatments and interventions, pulse oximetry, re-evaluation of patient's condition and review of old charts   Final Clinical Impressions(s) / ED Diagnoses     ICD-10-CM   1. Pain of upper abdomen  R10.10     2. Nausea  R11.0     3. Diarrhea, unspecified type  R19.7     4. Shortness of breath  R06.02     5. Nonspecific chest pain  R07.9       ED Discharge Orders     None       Discharge Instructions   None     Ozell HERO. Theadore, MD [facility] Emergency Medicine [facility] [email or web address]    Theadore Ozell HERO, MD 09/10/24 [id number]

## 2024-09-10 NOTE — Consult Note (Addendum)
 Hospital Consult    Reason for Consult: Concern for mesenteric ischemia Requesting Physician:  ED MRN #:  [id number]  History of Present Illness: Sheryl Stone is a 68 y.o. female with a PMH of CAD, HTN, anxiety, DMII, HLD, and fibromyalgia who was recently transferred to [facility] from [facility] with severe abdominal pain.   CTA abdomen/pelvis was obtained which demonstrated hemodynamically significant stenosis of the SMA and IMA.  She was started on a heparin  drip and transferred to [facility].  We were consulted for evaluation of the patient.  She states she has been having severe, global abdominal pain for a couple of days that got worse last night.  She endorses nausea and diarrhea.  She also endorses abdominal distention.  She denies any fevers or chills.  Past Medical History:  Diagnosis Date   Anxiety and depression    Back pain    Fibromyalgia    Hypertension    Panic attacks     Past Surgical History:  Procedure Laterality Date   PARTIAL HYSTERECTOMY      Allergies[1]  Prior to Admission medications  Medication Sig Start Date End Date Taking? Authorizing Provider  ALPRAZolam  (XANAX ) 0.5 MG tablet Take 1 tablet (0.5 mg total) by mouth 3 (three) times daily as needed for anxiety. 06/22/24   Sheryl Kenney NOVAK, FNP  aspirin  81 MG tablet Take 81 mg by mouth daily.    [provider]  atorvastatin  (LIPITOR) 80 MG tablet Take 80 mg by mouth daily.    [provider]  busPIRone  (BUSPAR ) 5 MG tablet TAKE 1 & 1/2 (ONE & ONE-HALF) TABLETS BY MOUTH TWICE DAILY 08/19/24   Beck, Taylor B, NP  Cholecalciferol ([employer] VIT D 5000 HIGH-POTENCY PO) Take by mouth.    [provider]  dicyclomine  (BENTYL ) 20 MG tablet Take 1 tablet by mouth twice daily 07/20/24   Beck, Taylor B, NP  ezetimibe  (ZETIA ) 10 MG tablet Take 1 tablet (10 mg total) by mouth daily. 08/13/24   Sheryl Waddell NOVAK, NP  fluconazole  (DIFLUCAN ) 150 MG tablet Take 1 tablet (150 mg  total) by mouth daily. May repeat in 3 days if needed. 07/06/24   Sheryl Waddell NOVAK, NP  gabapentin  (NEURONTIN ) 100 MG capsule Take 1 capsule (100 mg total) by mouth 3 (three) times daily. 07/06/24   Sheryl Waddell NOVAK, NP  lisinopril (ZESTRIL) 5 MG tablet Take 5 mg by mouth daily.    [provider]  metFORMIN  (GLUCOPHAGE -XR) 500 MG 24 hr tablet Take 2 tablets (1,000 mg total) by mouth daily with breakfast. 08/10/24   Sheryl Waddell NOVAK, NP  metoprolol  succinate (TOPROL -XL) 25 MG 24 hr tablet Take 1 tablet (25 mg total) by mouth daily. Take with or immediately following a meal 07/24/24   Sheryl Waddell NOVAK, NP  Multiple Vitamin (MULTIVITAMIN) tablet Take 1 tablet by mouth daily.    [provider]  ondansetron  (ZOFRAN -ODT) 4 MG disintegrating tablet Take 1 tablet (4 mg total) by mouth every 8 (eight) hours as needed for nausea or vomiting. 06/21/24   Dreama Longs, MD  pantoprazole  (PROTONIX ) 20 MG tablet Take 2 tablets (40 mg total) by mouth daily. 08/05/24   Sheryl Waddell NOVAK, NP  senna-docusate (SENOKOT-S) 8.6-50 MG tablet Take 1 tablet by mouth at bedtime. 09/23/17   Mackuen, Jackye Saha, MD    Social History   Socioeconomic History   Marital status: Married    Spouse name: Not on file   Number of children: Not  on file   Years of education: Not on file   Highest education level: Not on file  Occupational History   Not on file  Tobacco Use   Smoking status: Former   Smokeless tobacco: Never  Substance and Sexual Activity   Alcohol use: No   Drug use: No   Sexual activity: Not on file  Other Topics Concern   Not on file  Social History Narrative   Not on file   Social Drivers of Health   Tobacco Use: Medium Risk (09/09/2024)   Patient History    Smoking Tobacco Use: Former    Smokeless Tobacco Use: Never    Passive Exposure: Not on Actuary Strain: Not on file  Food Insecurity: Not on file  Transportation Needs: Not on file  Physical Activity: Not on  file  Stress: Not on file  Social Connections: Not on file  Intimate Partner Violence: Not on file  Depression (PHQ2-9): Low Risk (07/06/2024)   Depression (PHQ2-9)    PHQ-2 Score: 1  Alcohol Screen: Not on file  Housing: Not on file  Utilities: Not on file  Health Literacy: Not on file    History reviewed. No pertinent family history.  ROS: Otherwise negative unless mentioned in HPI  Physical Examination  Vitals:   09/10/24 1002 09/10/24 1131  BP:  112/87  Pulse: (!) 102 92  Resp: (!) 22 (!) 22  Temp:  98 F (36.7 C)  SpO2: 92% 99%   Body mass index is 26.57 kg/m.  General:  WDWN in NAD Gait: Not observed HENT: WNL, normocephalic Pulmonary: normal non-labored breathing Cardiac: regular Abdomen: Distended, tender to palpation in all quadrants Skin: without rashes Vascular Exam/Pulses: palpable femoral pulses bilaterally Extremities: without ischemic changes, without gangrene , without cellulitis; without open wounds;  Musculoskeletal: no muscle wasting or atrophy  Neurologic: A&O X 3;  No focal weakness or paresthesias are detected; speech is fluent/normal Psychiatric:  The pt has Normal affect. Lymph:  Unremarkable  CBC    Component Value Date/Time   WBC 8.2 09/09/2024 1628   RBC 4.14 09/09/2024 1628   HGB 13.3 09/09/2024 1628   HCT 38.8 09/09/2024 1628   PLT 373 09/09/2024 1628   MCV 93.7 09/09/2024 1628   MCH 32.1 09/09/2024 1628   MCHC 34.3 09/09/2024 1628   RDW 13.2 09/09/2024 1628   LYMPHSABS 2.8 05/05/2024 1511   MONOABS 0.5 05/05/2024 1511   EOSABS 0.2 05/05/2024 1511   BASOSABS 0.1 05/05/2024 1511    BMET    Component Value Date/Time   NA 132 (L) 09/09/2024 1543   K 4.3 09/09/2024 1543   CL 92 (L) 09/09/2024 1543   CO2 25 09/09/2024 1543   GLUCOSE 95 09/09/2024 1543   BUN 8 09/09/2024 1543   CREATININE 0.73 09/09/2024 1543   CALCIUM  10.0 09/09/2024 1543   GFRNONAA >60 09/09/2024 1543   GFRAA >60 09/23/2017 1450    COAGS: Lab  Results  Component Value Date   INR 1.2 12/19/2023     Non-Invasive Vascular Imaging:   CTA abdomen/pelvis (09/10/2024) 1. 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 2. Hemodynamically significant stenoses of the superior and inferior mesenteric artery origins. 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 No evidence of acute mesenteric ischemia   ASSESSMENT/PLAN: This is a 68 y.o. female admitted with  abdominal pain   - The patient presented to [facility] ED yesterday evening with severe abdominal pain.  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  She was placed on a heparin  drip and transferred to [facility] for further evaluation - The patient states she has been having severe abdominal pain for the past couple of days, which got worse last night.  She also endorses nausea, diarrhea, and abdominal distention.  She denies any fevers or chills -On exam her abdomen is distended and tender to palpation in all quadrants.  She appears anxious but not toxic -Dr.Ethel Meisenheimer has discussed with the patient that she has high-grade mesenteric stenosis with concern for possible acute mesenteric ischemia.  She does have an elevated lactic acid of 2.7.  She would benefit from urgent mesenteric revascularization in the OR today.  We have explained to the patient and family that this includes mesenteric angiography with access from the common femoral artery.  Intervention would likely include stenting of the SMA.  - We will also consult general surgery to help assess viability of the patient's intestines  -Continue heparin  drip, will plan on going to the OR later this afternoon for revascularization. The patient is  agreeable.   Ahmed Holster PA-C [facility] [phone]    VASCULAR STAFF ADDENDUM: I have independently interviewed and examined the patient. I agree with the above.  Persistent lactic acidosis and diffuse abdominal pain since yesterday worrisome for acute mesenteic ischemia. CTA = SMA and IMA significant stenosis. Plan aortogram with SMA intervention today in OR. I asked Dr. Ebbie of General Surgery to evaluate the patient as well for concern for AMI.   Debby SAILOR. Magda, MD FACS [facility] Phone Number: [phone] 09/10/2024 1:25 PM       [1]  Allergies Allergen Reactions   Latex Itching   Codeine Other (See Comments)    Other Reaction(s): Unknown   Dextromethorphan     Other Reaction(s): hyper   Diazepam     Other Reaction(s): Unknown   Diphenhydramine  Hcl     Other Reaction(s): Unknown   Doxycycline     Other Reaction(s): Unknown   Elemental Sulfur    Moxifloxacin     Other Reaction(s): diarrhea   Nitrofurantoin     Other Reaction(s): Unknown   Penicillins Other (See Comments)    All Cillins  Other Reaction(s): Unknown   Propoxyphene     Other Reaction(s): Unknown   Pseudoephedrine     Other Reaction(s): excess energy   Sulfa Antibiotics    Sulfamethoxazole     Other Reaction(s): Unknown   Sulfur Other (See Comments)   Tetracycline Hcl     Other Reaction(s): Unknown   Tramadol Other (See Comments)    Hyper sedation  Other Reaction(s): Unknown   Morphine Dermatitis

## 2024-09-10 NOTE — Op Note (Signed)
 09/10/2024  6:00 PM  PATIENT:  Sheryl Stone  68 y.o. 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 Brigitte Soderberg, MD  ASSISTANT: OR Staff  ANESTHESIA:   local and general  COUNTS:  Sponge, needle and instrument counts were reported correct x2 at the conclusion of the operation.  EBL: 5 mL  DRAINS: none  SPECIMEN: none  COMPLICATIONS: none  FINDINGS: Completely normal diagnostic laparoscopy with the exception of omental continue adhesions from her prior hysterectomy.  All bowel and viscera within the abdominal compartment are viable.  DISPOSITION: PACU in satisfactory condition  DESCRIPTION:  The patient was seen prior to surgery by our general surgery service. See Dr. Gelene notes for details. I am on call for our practice this evening and therefore came to the OR to help. The risks, benefits, complications, treatment options, and expected outcomes were previously discussed with the patient. I reviewed our service's note and the imaging. I discussed the case with Dr. Ebbie. She was already asleep under general anesthesia having undergone surgery Dr. Magda and was able to successfully stent her superior mesenteric artery.  I scrubbed in; she was then identified as Sheryl Stone, and the procedure verified. She was already supine on the operating table. The abdomen was already prepped and draped in the standard sterile fashion by Dr. Magda. A time out was completed and the above information confirmed and need for preoperative antibiotics.  We began with an infraumbilical Prescott entry.  Skin or anything umbilicus is incised.  Subcutaneous tissue divided lecture cautery.  Umbilical stalk is  elevated to Kocher clamp.  The fascia is incised sharply.  Peritoneal entry is carefully made bluntly.  A 0 Vicryl pursestring suture was placed.  The Hassan cannula was then cautiously introduced into the abdominal cavity.  Pneumoperitoneum was established to 15 mmHg with CO2.  Laparoscope was inserted and demonstrates no evidence of trocar site complication.  The abdomen is surveyed.  There are omental containing lesions into the pelvis.  There are no other significant adhesions noted throughout the abdominal wall.  In order to evaluate the bowel, it was necessary to lyse the adhesions in her pelvis to reflect the omentum cephalad.  This was done using hot scissors.  Hemostasis was verified.  The omentum was then reflected cephalad.  The omentum is viable.  Prior to doing this, the liver was inspected and normal in appearance.  The stomach is also normal in appearance.  The gallbladder is surgically absent.  The small bowel was then evaluated.  It is run from the ileocecal valve all the way back to the LoT.  It is completely normal in appearance and pink.  Next, we identified the appendix which is normal as well as the cecum, ascending colon, and hepatic flexure.  These are all viable and healthy in appearance.  The transverse colon was evaluated and normal in appearance.  The splenic flexure was normal in appearance.  The descending colon, sigmoid, and intraperitoneal portions of the rectum are normal in appearance.  Complete evaluation of the adnexal structures is difficult due to habitus.  It was noted on her preoperative CT that there was a possible ovarian neoplasm.  I was not able to confidently identify it at this time as we are restricted on table motion  on our vascular surgery table and additionally did not want her to slide.  All bowel is confirmed to be in its anatomic orientation.  The omentum was brought back down.  It is hemostatic.  Laparoscopic trocars removed under direct visualization.  The  Hassan cannula is removed after evacuating pneumoperitoneum.  The previously placed 0 Vicryl fascial suture was then tied obliterating the umbilical fascial defect.  All sponge, needle, and instrument counts were reported correct for our portion of the procedure.  Local anesthetic is infiltrated at all incision sites. Skin is approximated with 4-0 monocryl subcuticular suture. The abdomen is washed and dried. Dermabond is applied.  She was subsequently awakened from anesthesia and transferred to a stretcher for transport to recovery in satisfactory condition.

## 2024-09-10 NOTE — ED Provider Notes (Signed)
 Care assumed from [facility], PA-C at shift change.  Please see their note for further information. Physical Exam  BP (!) 157/72   Pulse 86   Temp 98.1 F (36.7 C) (Oral)   Resp 20   Ht 5' 3 (1.6 m)   Wt 68 kg   SpO2 92%   BMI 26.57 kg/m   Physical Exam Vitals and nursing note reviewed.  Constitutional:      General: She is not in acute distress.    Appearance: Normal appearance. She is normal weight. She is not ill-appearing, toxic-appearing or diaphoretic.  HENT:     Head: Normocephalic and atraumatic.  Cardiovascular:     Rate and Rhythm: Normal rate.  Pulmonary:     Effort: Pulmonary effort is normal. No respiratory distress.  Abdominal:     Tenderness: There is abdominal tenderness.  Musculoskeletal:        General: Normal range of motion.     Cervical back: Normal range of motion.  Skin:    General: Skin is warm and dry.  Neurological:     General: No focal deficit present.     Mental Status: She is alert.  Psychiatric:        Mood and Affect: Mood normal.        Behavior: Behavior normal.     Procedures  .Critical Care  Performed by: Nora Lauraine LABOR, PA-C Authorized by: Posey Jasmin A, PA-C   Critical care provider statement:    Critical care time (minutes):  35   Critical care was necessary to treat or prevent imminent or life-threatening deterioration of the following conditions:  Circulatory failure   Critical care was time spent personally by me on the following activities:  Development of treatment plan with patient or surrogate, discussions with consultants, discussions with primary provider, evaluation of patient's response to treatment, examination of patient, obtaining history from patient or surrogate, ordering and review of laboratory studies, ordering and review of radiographic studies and re-evaluation of patient's condition   Care discussed with: admitting provider     ED Course / MDM   Clinical Course as of 09/10/24 0007  Wed Sep 09, 2024  1628 BP elevated.  Reported to bedside.  Patient just receiving pain medication.  Suspect elevated BP likely secondary to pain.  Will recheck in 15 minutes.  Patient notes she took her BP medications this morning. [CA]  1653 Rechecked BP. 127/60 [CA]  1727 Alkaline Phosphatase(!): 149 [CA]  1727 ALT(!): 54 [CA]  1727 AST(!): 48 [CA]    Clinical Course User Index [CA] Lorelle Aleck BROCKS, PA-C   Medical Decision Making Amount and/or Complexity of Data Reviewed Labs: ordered. Decision-making details documented in ED Course. Radiology: ordered.  Risk Prescription drug management.   Briefly: Patient with severe 10/10 abdominal pain that started last night, nausea with diarrhea.   Plan: labs and imaging overall unremarkable, slight transaminitis. However given persistent severe pain without clear etiology, plan for lactic acid and CTA to evaluate for mesenteric ischemia  CTA has resulted and reveals  1. 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 2. Hemodynamically significant stenoses of the superior and inferior mesenteric artery origins. 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 No evidence of acute mesenteric ischemia. 3. Peripheral vascular disease with hemodynamically significant lower extremity arterial inflow disease as outlined above.  In the setting of gluteal claudication or lower extremity arterovascular insufficiency, vascular consultation is recommended for further management. 4. Moderate hepatic steatosis and mild hepatomegaly. 5. 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 Gynecologic consultation is recommended for further evaluation.   I  personally reviewed and interpreted this imaging and agree with radiology interpretation.  Patient's lactic acid has resulted and is 3.3.  Ordered IV fluids.  Given above findings, discussed patient with vascular surgery on-call Dr. Ethyl who reviewed imaging and reported that imaging did not appear necessarily consistent with acute mesenteric ischemia, however given elevated lactic acid and severe pain, recommends ICU admission, they will see the patient in consultation.  Does recommend IV heparin .  Discussed same with patient  Discussed with Dr. Layman with ICU who reports patient does not meet criteria for ICU admission and recommends hospitalist admission.  Hospitalist consult is pending at shift change.   Care handoff to EDP Dr. Theadore at shift change. Please see their note for continued evaluation and disposition  This is a shared visit with supervising physician Dr. Ginger who has independently evaluated patient & provided guidance in evaluation/management/disposition, in agreement with care     Nora Lauraine LABOR, PA-C 09/10/24 0048    Tegeler, Lonni PARAS, MD 09/10/24 [phone]

## 2024-09-10 NOTE — H&P (Signed)
 History and Physical    Sheryl Stone FMW:[id number] DOB: [birth year] DOA: 09/09/2024  PCP: Sheryl Stone NOVAK, NP   Patient coming from:Home  to [facility] and direct admit  Chief Complaint  Patient presents with   Abdominal Pain     HPI: Sheryl Stone is a 68 y.o. female with PMH of CAD s/p stent in 2022, hypertension hyperlipidemia, anxiety disorder, type II diabetes mellitus, fibromyalgia presented to the ED 12/17 evening with acute abdominal pain severe in nature, with associated nausea, diarrhea and some abdominal distention. She endorses abdomen pain for many years at times worse with eating, also has fibromyalgia and gets leg pain on walking as well. She c/o sweating while in ambulance but no chest pain. Whever ehs gets frustrated or lifts something she gets chest pain too. Patient otherwise denies any chest pain, shortness of breath, fever, chills, headache, focal weakness, numbness tingling, speech difficulties.  Has mild pain now about 6-7/10 and has been 10 on admission and says it ha been 10 at home for several days  In the ED at [facility]: CMP with mild hyponatremia mild transaminitis stable CBC COVID influenza flu negative troponin less than 15 elevated lactic acid 3.3 CXR no active disease.  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  Moderate hepatic steatosis, hepatomegaly biliary duct dilatation postcholecystectomy PVD moderate sigmoid diverticulosis CTA: High-grade (>90%) stenosis of the inferior mesenteric artery at its origin, which may be relevant to the clinical presentation of mesenteric ischemia and warrants prompt vascular/surgical evaluation. Hemodynamically significant stenoses of the superior and inferior mesenteric artery origins. The findings would support the diagnosis of chronic mesenteric ischemia in the appropriate clinical  setting. No evidence of acute mesenteric ischemia. Peripheral vascular disease with hemodynamically significant lower extremity arterial inflow disease as outlined above. In the setting of gluteal claudication or lower extremity arterovascular insufficiency, vascular consultation is recommended for further management. Patient received IV fluid hydration, heparin  drip Sheryl. Serene from vascular was consulted recommended [facility] evaluation, heparin  drip for anticoagulation, did not feel patient needed emergent surgical intervention. EDP discussed case with on-call PCCM Sheryl [facility]yl Stone who felt that patient was appropriate for admission to the Hospitalist service.  Patient arrived to floor on 12/18 afternoon  Assessment and plan:  Acute severe abdominal pain High-grade stenosis IMA >90% Hemodynamically significant stenosis SMA and IMA origin PVD Lactic acidosis: Patient presented with severe abdominal pain with lactic acidosis in the setting of stenosis high-grade as on CTA, and this finding concerning for mesenteric ischemia.  Lactic acid was downtrending but still up, vitals stable Continue IV fluid hydration currently her mild tachycardia but better BP stable Keep n.p.o. IV fluids PPI and pain management. Vascular team notified of patient's arrival. VVS/CCS planning possible urgent surgery  PVD with lower extremity arterial inflow disease: See CTA findings- VVS consulted. C/o leg claudications.  Hepatic steatosis/hepatomegaly: Monitor LFTs patient on statin po meds on hold.  CAD w/p stent in 2022 Sheryl Stone at [facility] HTN H LD: Troponin unremarkable admission, endorses some chest pain on and off but with activity no chest pain and attributes her pain to fibromyalgia and anxiety. BP hypertensive on arrival. Last stress test in careeverywhere 02/14/22:No reversible ischemia or infarction.  Hypokinesis of the apex. Otherwise normal limiting ir wall  motion.Left ventricular ejection fraction 71%>  EKG on admit  a nsr, abnormal R wave progression. Check trop. Po meds on  hold  Type 2 diabetes mellitus: Hold home [facility]- start q6 hr ssi.  Anxiety disorder Fibromyalgia: PTA on buspar , off xanax  recently, on tylenol  prn, gabapentin  and her regimen has been ineffective.  DVT prophylaxis: heaprin gtt Code Status:   Code Status: Not on file Family Communication: plan of care discussed with patient at bedside. Patient status is: Remains hospitalized because of severity of illness Level of care: Progressive   Dispo: The patient is from: HOME and husband at baseline.            Anticipated disposition: TBD Objective: Vitals last 24 hrs: Vitals:   09/10/24 0957 09/10/24 1000 09/10/24 1002 09/10/24 1131  BP:    112/87  Pulse:  (!) 106 (!) 102 92  Resp:  13 (!) 22 (!) 22  Temp: 98.4 F (36.9 C)   98 F (36.7 C)  TempSrc: Oral   Oral  SpO2:  92% 92% 99%  Weight:      Height:        Physical Examination: General exam: alert awake, oriented, anxious HEENT:Oral mucosa moist, Ear/Nose WNL grossly Respiratory system: Bilaterally clear BS,no use of accessory muscle Cardiovascular system: S1 & S2 +, No JVD. Gastrointestinal system: Abdomen soft,Tender on lower abdomen,ND, BS+ Nervous System: Alert, awake, moving all extremities,and following commands. Extremities: extremities cool but perfused Skin: Warm, no rashes MSK: Normal muscle bulk,tone, power   Medications reviewed:  Scheduled Meds: Continuous Infusions:  heparin  1,100 Units/hr (09/10/24 0114)   Diet: Diet Order             Diet NPO time specified  Diet effective now                     Severity of Illness: The appropriate patient status for this patient is INPATIENT. Inpatient status is judged to be reasonable and necessary in order to provide the required intensity of service to ensure the patient's safety. The patient's presenting symptoms, physical exam findings, and initial radiographic and laboratory data in the context  of their chronic comorbidities is felt to place them at high risk for further clinical deterioration. Furthermore, it is not anticipated that the patient will be medically stable for discharge from the hospital within 2 midnights of admission.   * I certify that at the point of admission it is my clinical judgment that the patient will require inpatient hospital care spanning beyond 2 midnights from the point of admission due to high intensity of service, high risk for further deterioration and high frequency of surveillance required.*  Family Communication: Admission, patients condition and plan of care including tests being ordered have been discussed with the patient and husband who indicate understanding and agree with the plan and Code Status.  Consults called:  VVS CCS PCCM by  EDP  Review of Systems: All systems were reviewed and were negative except as mentioned in HPI above. Negative for fever Negative for chest pain Negative for shortness of breath  Past Medical History:  Diagnosis Date   Anxiety and depression    Back pain    Fibromyalgia    Hypertension    Panic attacks     Past Surgical History:  Procedure Laterality Date   PARTIAL HYSTERECTOMY       reports that she has quit smoking. She has never used smokeless tobacco. She reports that she does not drink alcohol and does not use drugs.  Allergies[1]  History reviewed. No pertinent family history.   Prior to Admission medications  Medication Sig Start Date End Date Taking? Authorizing Provider  ALPRAZolam  (XANAX ) 0.5 MG tablet Take 1 tablet (0.5 mg total) by mouth 3 (three) times daily as needed for anxiety. 06/22/24   Douglass Kenney NOVAK, FNP  aspirin  81 MG tablet Take 81 mg by mouth daily.    [provider]  atorvastatin  (LIPITOR) 80 MG tablet Take 80 mg by mouth daily.    [provider]  busPIRone  (BUSPAR ) 5 MG tablet TAKE 1 & 1/2 (ONE & ONE-HALF) TABLETS BY MOUTH TWICE DAILY 08/19/24   Beck,  Taylor B, NP  Cholecalciferol ([employer] VIT D 5000 HIGH-POTENCY PO) Take by mouth.    [provider]  dicyclomine  (BENTYL ) 20 MG tablet Take 1 tablet by mouth twice daily 07/20/24   Beck, Taylor B, NP  ezetimibe  (ZETIA ) 10 MG tablet Take 1 tablet (10 mg total) by mouth daily. 08/13/24   [facility]yl Stone NOVAK, NP  fluconazole  (DIFLUCAN ) 150 MG tablet Take 1 tablet (150 mg total) by mouth daily. May repeat in 3 days if needed. 07/06/24   [facility]yl Stone NOVAK, NP  gabapentin  (NEURONTIN ) 100 MG capsule Take 1 capsule (100 mg total) by mouth 3 (three) times daily. 07/06/24   Sheryl Stone NOVAK, NP  lisinopril (ZESTRIL) 5 MG tablet Take 5 mg by mouth daily.    [provider]  metFORMIN  (GLUCOPHAGE -XR) 500 MG 24 hr tablet Take 2 tablets (1,000 mg total) by mouth daily with breakfast. 08/10/24   [facility]yl Stone NOVAK, NP  metoprolol  succinate (TOPROL -XL) 25 MG 24 hr tablet Take 1 tablet (25 mg total) by mouth daily. Take with or immediately following a meal 07/24/24   Sheryl Stone NOVAK, NP  Multiple Vitamin (MULTIVITAMIN) tablet Take 1 tablet by mouth daily.    [provider]  ondansetron  (ZOFRAN -ODT) 4 MG disintegrating tablet Take 1 tablet (4 mg total) by mouth every 8 (eight) hours as needed for nausea or vomiting. 06/21/24   Dreama Longs, MD  pantoprazole  (PROTONIX ) 20 MG tablet Take 2 tablets (40 mg total) by mouth daily. 08/05/24   Sheryl Stone NOVAK, NP  senna-docusate (SENOKOT-S) 8.6-50 MG tablet Take 1 tablet by mouth at bedtime. 09/23/17   Mackuen, Jackye Saha, MD  Labs on Admission: I have personally reviewed following labs and imaging studies.  CBC: Recent Labs  Lab 09/09/24 1628  WBC 8.2  HGB 13.3  HCT 38.8  MCV 93.7  PLT 373   Basic Metabolic Panel: Recent Labs  Lab 09/09/24 1543  NA 132*  K 4.3  CL 92*  CO2 25  GLUCOSE 95  BUN 8  CREATININE 0.73  CALCIUM  10.0  Estimated Creatinine Clearance: 60.5 mL/min (by C-G formula based on SCr of 0.73 mg/dL). Recent Labs  Lab  09/09/24 1543  AST 48*  ALT 54*  ALKPHOS 149*  BILITOT 0.4  PROT 8.1  ALBUMIN  4.9   Recent Labs  Lab 09/09/24 1543  LIPASE 32  CBG: Recent Labs  Lab 09/10/24 1000 09/10/24 1205  GLUCAP 145* 154*  Lipid Profile: No results for input(s): CHOL, HDL, LDLCALC, TRIG, CHOLHDL, LDLDIRECT in the last 72 hours. Thyroid Function Tests: No results for input(s): TSH, T4TOTAL, FREET4, T3FREE, THYROIDAB in the last 72 hours. Urine analysis:    Component Value Date/Time   COLORURINE YELLOW 09/09/2024 1418   APPEARANCEUR CLEAR 09/09/2024 1418   LABSPEC 1.015 09/09/2024 1418   PHURINE 7.0 09/09/2024 1418   GLUCOSEU NEGATIVE 09/09/2024 1418   HGBUR NEGATIVE 09/09/2024 1418   BILIRUBINUR NEGATIVE 09/09/2024 1418  KETONESUR NEGATIVE 09/09/2024 1418   PROTEINUR NEGATIVE 09/09/2024 1418   UROBILINOGEN 0.2 09/28/2007 0901   NITRITE NEGATIVE 09/09/2024 1418   LEUKOCYTESUR TRACE (A) 09/09/2024 1418    Radiological Exams on Admission: CT Angio Abd/Pel W and/or Wo Contrast Result Date: 09/09/2024 EXAM: CTA ABDOMEN AND PELVIS WITH AND WITHOUT CONTRAST 09/09/2024 09:02:02 PM TECHNIQUE: CTA images of the abdomen and pelvis with and without intravenous contrast. Three-dimensional MIP/volume rendered formations were performed. Automated exposure control, iterative reconstruction, and/or weight based adjustment of the mA/kV was utilized to reduce the radiation dose to as low as reasonably achievable. COMPARISON: Prior examination of 5:49 pm. CLINICAL HISTORY: severe abdominal pain. Mesenteric ischemia. FINDINGS: VASCULATURE: AORTA: Extensive aortoiliac atherosclerotic calcification. No aortic aneurysm or dissection. No hemodynamically significant stenosis. CELIAC TRUNK: There is some 50% stenosis of the celiac axis origin. Normal anatomic configuration. Note is made of an accessory left hepatic artery. No aneurysm or dissection. SUPERIOR MESENTERIC ARTERY: 75% stenosis of the superior  mesenteric artery at its origin. Distally widely patent without aneurysmal dissection. RENAL ARTERIES: Single renal arteries bilaterally are widely patent and demonstrate normal vascular morphology. No aneurysm or dissection. ILIAC ARTERIES: Multifocal 50% stenosis of the proximal right common iliac artery. Moderate calcific plaque within the right external iliac artery without hemodynamically significant stenosis. Right internal iliac artery is diseased with patent its origin. Focal 75% stenosis of the left common iliac artery secondary to calcified atherosclerotic plaque. Less than 50% stenosis of the left external iliac artery secondary to calcified atherosclerotic plaque. New occlusion of the left internal iliac artery at its origin secondary to calcified plaque. LIVER: Moderate hepatic steatosis. Mild hepatomegaly. Correlation with liver enzymes may be helpful to exclude an obstructive or inflammatory process. GALLBLADDER AND BILE DUCTS: Status post cholecystectomy. No biliary ductal dilatation. SPLEEN: The spleen is unremarkable. PANCREAS: The pancreas is unremarkable. ADRENAL GLANDS: Bilateral adrenal glands demonstrate no acute abnormality. KIDNEYS, URETERS AND BLADDER: No stones in the kidneys or ureters. No hydronephrosis. No perinephric or periureteral stranding. Urinary bladder is unremarkable. 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 Distally patent. Moderate sigmoid diverticulosis without superimposed focal inflammatory change. No evidence of regional bowel ischemia. The stomach, small bowel, and large bowel are otherwise unremarkable. Appendix absent. REPRODUCTIVE: Status post hysterectomy. Complex mixed solid and cystic 4.5 cm right ovarian mass again identified. Gynecologic consultation is recommended for further management, as noted previously, given its interval increase in size since remote prior examination of September 23, 2017.  PERITONEUM AND RETROPERITONEUM: No ascites or free air. LUNG BASE: No acute abnormality. LYMPH NODES: No lymphadenopathy. BONES AND SOFT TISSUES: No acute abnormality of the bones. No acute soft tissue abnormality. RAF SCORE: Aortic atherosclerosis (ICD10-I70.0) IMPRESSION: 1. 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 2. Hemodynamically significant stenoses of the superior and inferior mesenteric artery origins. 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 No evidence of acute mesenteric ischemia. 3. Peripheral vascular disease with hemodynamically significant lower extremity arterial inflow disease as outlined above. In the setting of gluteal claudication or lower extremity arterovascular insufficiency, vascular consultation is recommended for further management. 4. Moderate hepatic steatosis and mild hepatomegaly. 5. 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 Gynecologic consultation  is recommended for further evaluation. Electronically signed by: Dorethia Molt MD 09/09/2024 09:19 PM EST RP Workstation: [id number]   CT ABDOMEN PELVIS W CONTRAST Result Date: 09/09/2024 EXAM: CT ABDOMEN AND PELVIS WITH CONTRAST 09/09/2024 06:11:50 PM TECHNIQUE: CT of the abdomen and pelvis was performed with the administration of 100 mL of iohexol  (OMNIPAQUE ) 300 MG/ML solution. Multiplanar reformatted images are provided for review. Automated exposure control, iterative reconstruction, and/or weight-based adjustment of the mA/kV was utilized to reduce the radiation dose to as low as reasonably achievable. COMPARISON: Prior examinations of 12/19/2023 and 09/23/2017. CLINICAL HISTORY: Abdominal pain,  acute, nonlocalized. FINDINGS: LOWER CHEST: No acute abnormality. LIVER: Moderate hepatic steatosis and hepatomegaly. GALLBLADDER AND BILE DUCTS: Status post cholecystectomy. Mild dilation of the biliary tree likely reflects post cholecystectomy change. Correlation with liver enzymes may be helpful to exclude an obstructive or inflammatory process. SPLEEN: No acute abnormality. PANCREAS: No acute abnormality. ADRENAL GLANDS: No acute abnormality. KIDNEYS, URETERS AND BLADDER: No stones in the kidneys or ureters. No hydronephrosis. No perinephric or periureteral stranding. Urinary bladder is unremarkable. Particularly prominent atherosclerotic calcifications seen at the origin of the right renal artery. If there is clinical evidence of hemodynamically significant renal artery stenosis, CT arteriography may be more helpful for further evaluation. GI AND BOWEL: Moderate sigmoid diverticulosis. Appendix absent. The stomach, small bowel, and large bowel are otherwise unremarkable. There is no bowel obstruction. PERITONEUM AND RETROPERITONEUM: No ascites. No free air. VASCULATURE: Extensive aortoiliac atherosclerotic calcifications. Particularly prominent atherosclerotic calcifications seen at the origin of the mesenteric and bilateral lower extremity arterial inflow. Peripheral vascular disease. If there is clinical evidence of chronic mesenteric ischemia or lower extremity claudication, CT arteriography may be more helpful for further evaluation. LYMPH NODES: No lymphadenopathy. REPRODUCTIVE ORGANS: Uterus is absent. Residual left ovaries unremarkable. 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 This mass measures 4.2 x 4.5 x 3.3 cm. Volume calculated as 32.7 cm. Gynecologic consultation is recommended for further  evaluation. BONES AND SOFT TISSUES: No acute osseous abnormality. No focal soft tissue abnormality. RAF SCORE: Aortic atherosclerosis (ICD10-I70.0) IMPRESSION: 1. 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 2. Moderate hepatic steatosis and hepatomegaly. 3. Mild dilation of the biliary tree likely reflects post cholecystectomy change, and correlation with liver enzymes may be helpful to exclude an obstructive or inflammatory process. 4. Peripheral vascular disease, and if there is clinical evidence of hemodynamically significant renal artery stenosis, chronic mesenteric ischemia, or lower extremity claudication, CT arteriography may be more helpful for further evaluation. 5. Moderate sigmoid diverticulosis without evidence of diverticulitis. 6. RAF score includes Aortic atherosclerosis (ICD10-I70.0). Electronically signed by: Dorethia Molt MD 09/09/2024 06:26 PM EST RP Workstation: [id number]   DG Chest Portable 1 View Result Date: 09/09/2024 CLINICAL DATA:  Shortness of breath and chest pain. EXAM: PORTABLE CHEST 1 VIEW COMPARISON:  None Available. FINDINGS: No focal consolidation, pleural effusion, or pneumothorax. Background of emphysema. Mild cardiomegaly. Atherosclerotic calcification of the aorta. No acute osseous pathology. IMPRESSION: 1. No active disease. 2. Mild cardiomegaly. Electronically Signed   By: Vanetta Chou M.D.   On: 09/09/2024 15:18      Cache Bills  MD [facility]  If 7PM-7AM, please contact night-coverage [email or web address]  09/10/2024, 12:34 PM       [1]  Allergies Allergen Reactions   Latex Itching   Codeine Other (See Comments)    Other Reaction(s): Unknown   Dextromethorphan     Other Reaction(s): hyper   Diazepam      Other Reaction(s): Unknown   Diphenhydramine  Hcl     Other Reaction(s): Unknown   Doxycycline     Other Reaction(s): Unknown   Elemental Sulfur    Moxifloxacin     Other Reaction(s): diarrhea   Nitrofurantoin     Other Reaction(s): Unknown   Penicillins Other (See Comments)    All Cillins  Other Reaction(s): Unknown   Propoxyphene     Other Reaction(s): Unknown   Pseudoephedrine     Other Reaction(s): excess energy   Sulfa Antibiotics    Sulfamethoxazole     Other Reaction(s): Unknown   Sulfur Other (See Comments)   Tetracycline Hcl     Other Reaction(s): Unknown   Tramadol Other (See Comments)    Hyper sedation  Other Reaction(s): Unknown   Morphine Dermatitis

## 2024-09-11 ENCOUNTER — Encounter (HOSPITAL_COMMUNITY): Payer: Self-pay | Admitting: Vascular Surgery

## 2024-09-11 ENCOUNTER — Other Ambulatory Visit (HOSPITAL_COMMUNITY): Payer: Self-pay

## 2024-09-11 DIAGNOSIS — R109 Unspecified abdominal pain: Secondary | ICD-10-CM

## 2024-09-11 LAB — GLUCOSE, CAPILLARY
Glucose-Capillary: 131 mg/dL — ABNORMAL HIGH (ref 70–99)
Glucose-Capillary: 145 mg/dL — ABNORMAL HIGH (ref 70–99)
Glucose-Capillary: 153 mg/dL — ABNORMAL HIGH (ref 70–99)
Glucose-Capillary: 121 mg/dL — ABNORMAL HIGH (ref 70–99)
Glucose-Capillary: 168 mg/dL — ABNORMAL HIGH (ref 70–99)

## 2024-09-11 LAB — COMPREHENSIVE METABOLIC PANEL WITH GFR
ALT: 50 U/L — ABNORMAL HIGH (ref 0–44)
AST: 48 U/L — ABNORMAL HIGH (ref 15–41)
Albumin: 4.3 g/dL (ref 3.5–5.0)
Alkaline Phosphatase: 112 U/L (ref 38–126)
Anion gap: 13 (ref 5–15)
BUN: 9 mg/dL (ref 8–23)
CO2: 22 mmol/L (ref 22–32)
Calcium: 8.6 mg/dL — ABNORMAL LOW (ref 8.9–10.3)
Chloride: 96 mmol/L — ABNORMAL LOW (ref 98–111)
Creatinine, Ser: 0.8 mg/dL (ref 0.44–1.00)
GFR, Estimated: >60 mL/min
Glucose, Bld: 139 mg/dL — ABNORMAL HIGH (ref 70–99)
Potassium: 4.1 mmol/L (ref 3.5–5.1)
Sodium: 131 mmol/L — ABNORMAL LOW (ref 135–145)
Total Bilirubin: 0.3 mg/dL (ref 0.0–1.2)
Total Protein: 6.6 g/dL (ref 6.5–8.1)

## 2024-09-11 LAB — URINE CULTURE: Culture: <10000 — AB

## 2024-09-11 LAB — CBC
HCT: 33 % — ABNORMAL LOW (ref 36.0–46.0)
Hemoglobin: 10.7 g/dL — ABNORMAL LOW (ref 12.0–15.0)
MCH: 32.2 pg (ref 26.0–34.0)
MCHC: 32.4 g/dL (ref 30.0–36.0)
MCV: 99.4 fL (ref 80.0–100.0)
Platelets: 349 K/uL (ref 150–400)
RBC: 3.32 MIL/uL — ABNORMAL LOW (ref 3.87–5.11)
RDW: 13.6 % (ref 11.5–15.5)
WBC: 8 K/uL (ref 4.0–10.5)
nRBC: 0 % (ref 0.0–0.2)

## 2024-09-11 LAB — POCT ACTIVATED CLOTTING TIME: Activated Clotting Time: 143 s

## 2024-09-11 MED ORDER — DICYCLOMINE HCL 20 MG PO TABS
20.0000 mg | ORAL_TABLET | Freq: Two times a day (BID) | ORAL | Status: DC | PRN
  Administered 2024-09-14: 20 mg via ORAL
  Filled 2024-09-11 (×2): qty 1

## 2024-09-11 MED ORDER — DIPHENHYDRAMINE HCL 25 MG PO CAPS
25.0000 mg | ORAL_CAPSULE | Freq: Four times a day (QID) | ORAL | Status: DC | PRN
  Administered 2024-09-11 – 2024-09-14 (×6): 25 mg via ORAL
  Filled 2024-09-11 (×7): qty 1

## 2024-09-11 MED ORDER — EZETIMIBE 10 MG PO TABS
10.0000 mg | ORAL_TABLET | Freq: Every day | ORAL | Status: DC
  Administered 2024-09-11 – 2024-09-14 (×4): 10 mg via ORAL
  Filled 2024-09-11 (×4): qty 1

## 2024-09-11 MED ORDER — GABAPENTIN 100 MG PO CAPS
100.0000 mg | ORAL_CAPSULE | Freq: Three times a day (TID) | ORAL | Status: DC
  Administered 2024-09-11 – 2024-09-14 (×10): 100 mg via ORAL
  Filled 2024-09-11 (×10): qty 1

## 2024-09-11 MED ORDER — BUSPIRONE HCL 5 MG PO TABS
7.5000 mg | ORAL_TABLET | Freq: Three times a day (TID) | ORAL | Status: DC
  Administered 2024-09-11 – 2024-09-14 (×10): 7.5 mg via ORAL
  Filled 2024-09-11 (×10): qty 2

## 2024-09-11 MED ORDER — ATORVASTATIN CALCIUM 80 MG PO TABS
80.0000 mg | ORAL_TABLET | Freq: Every day | ORAL | Status: DC
  Administered 2024-09-12 – 2024-09-14 (×3): 80 mg via ORAL
  Filled 2024-09-11 (×3): qty 1

## 2024-09-11 MED ORDER — POLYETHYLENE GLYCOL 3350 17 G PO PACK
17.0000 g | PACK | Freq: Every day | ORAL | Status: DC
  Administered 2024-09-11 – 2024-09-12 (×2): 17 g via ORAL
  Filled 2024-09-11 (×3): qty 1

## 2024-09-11 MED ORDER — CLOPIDOGREL BISULFATE 75 MG PO TABS
75.0000 mg | ORAL_TABLET | Freq: Every day | ORAL | 0 refills | Status: DC
  Filled 2024-09-11: qty 30, 30d supply, fill #0

## 2024-09-11 MED ORDER — METOPROLOL SUCCINATE ER 25 MG PO TB24
25.0000 mg | ORAL_TABLET | Freq: Every day | ORAL | Status: DC
  Administered 2024-09-11 – 2024-09-14 (×4): 25 mg via ORAL
  Filled 2024-09-11 (×4): qty 1

## 2024-09-11 NOTE — Progress Notes (Signed)
 Pt ambulated in hallway Gas (+) BM (-)

## 2024-09-11 NOTE — Plan of Care (Signed)
  Problem: Education: Goal: Knowledge of General Education information will improve Description: Including pain rating scale, medication(s)/side effects and non-pharmacologic comfort measures Outcome: Progressing   Problem: Health Behavior/Discharge Planning: Goal: Ability to manage health-related needs will improve Outcome: Progressing   Problem: Clinical Measurements: Goal: Cardiovascular complication will be avoided Outcome: Progressing   Problem: Coping: Goal: Level of anxiety will decrease Outcome: Progressing   

## 2024-09-11 NOTE — Anesthesia Postprocedure Evaluation (Signed)
"   Anesthesia Post Note  Patient: Sheryl Stone  Procedure(s) Performed: AORTOGRAM INSERTION, ENDOVASCULAR STENT GRAFT, SUPERIOR MESENTERIC ARTERY ULTRASOUND GUIDANCE, FOR VASCULAR ACCESS (Groin) LAPAROSCOPY, DIAGNOSTIC WITH LYSIS OF ADHESIONS (Abdomen)     Patient location during evaluation: PACU Anesthesia Type: General Level of consciousness: awake Pain management: pain level controlled Vital Signs Assessment: post-procedure vital signs reviewed and stable Respiratory status: spontaneous breathing, nonlabored ventilation and respiratory function stable Cardiovascular status: blood pressure returned to baseline and stable Postop Assessment: no apparent nausea or vomiting Anesthetic complications: no   No notable events documented.  Last Vitals:  Vitals:   09/11/24 0911 09/11/24 1105  BP: (!) 167/72 120/80  Pulse: (!) 108 (!) 112  Resp: 20 20  Temp: 36.8 C 36.6 C  SpO2: 98% 95%    Last Pain:  Vitals:   09/11/24 1105  TempSrc: Oral  PainSc:                  Sallye Lunz P Karman Veney      "

## 2024-09-11 NOTE — Progress Notes (Addendum)
" °  Progress Note    09/11/2024 7:46 AM 1 Day Post-Op  Subjective: Says her abdominal pain feels much better, but has not completely gone away.  Ate some peas and potatoes last night without abdominal pain   Vitals:   09/10/24 2339 09/11/24 0303  BP: (!) 154/68 (!) 164/79  Pulse: 95 94  Resp: 19 19  Temp: 98.3 F (36.8 C) 98 F (36.7 C)  SpO2: 94% 96%    Physical Exam: General:  sitting up in bed, NAD Cardiac:  regular Lungs:  nonlabored Incisions: Right groin cath site soft, intact without hematoma Abdomen: Soft, minimally tender in the lower abdomen  CBC    Component Value Date/Time   WBC 8.0 09/11/2024 0415   RBC 3.32 (L) 09/11/2024 0415   HGB 10.7 (L) 09/11/2024 0415   HCT 33.0 (L) 09/11/2024 0415   PLT 349 09/11/2024 0415   MCV 99.4 09/11/2024 0415   MCH 32.2 09/11/2024 0415   MCHC 32.4 09/11/2024 0415   RDW 13.6 09/11/2024 0415   LYMPHSABS 2.8 05/05/2024 1511   MONOABS 0.5 05/05/2024 1511   EOSABS 0.2 05/05/2024 1511   BASOSABS 0.1 05/05/2024 1511    BMET    Component Value Date/Time   NA 131 (L) 09/11/2024 0415   K 4.1 09/11/2024 0415   CL 96 (L) 09/11/2024 0415   CO2 22 09/11/2024 0415   GLUCOSE 139 (H) 09/11/2024 0415   BUN 9 09/11/2024 0415   CREATININE 0.80 09/11/2024 0415   CALCIUM  8.6 (L) 09/11/2024 0415   GFRNONAA >60 09/11/2024 0415   GFRAA >60 09/23/2017 1450    INR    Component Value Date/Time   INR 1.2 12/19/2023 1855     Intake/Output Summary (Last 24 hours) at 09/11/2024 0746 Last data filed at 09/11/2024 0400 Gross per 24 hour  Intake 2877.5 ml  Output 340 ml  Net 2537.5 ml      Assessment/Plan:  68 y.o. female is 1 day postop, s/p: SMA angioplasty and stenting   - She says her abdominal pain feels much better this morning.  She tolerated some food last night without any postprandial abdominal pain -Right groin access site soft without hematoma -Her abdomen is soft and much less tender this morning -She received  aspirin  and Plavix  load last night.  Continue daily baby aspirin  and Plavix  75 mg for at least 1 month for stent patency.  Continue statin -Stable for discharge from vascular perspective.  Will arrange follow-up with our office in 1 month with mesenteric duplex  Ahmed Holster, PA-C [facility] [phone] 09/11/2024 7:46 AM  VASCULAR STAFF ADDENDUM: I have independently interviewed and examined the patient. I agree with the above.  Good result from mesenteric intervention.  Pain is subjectively improved.  Has not really had any p.o. yet.  Passing flatus.  Right groin is soft without significant hematoma.  Okay for discharge from my standpoint.  She can follow-up with me in 4 weeks with mesenteric duplex.  Debby SAILOR. Magda, MD [facility] Phone Number: [phone] 09/11/2024 8:28 AM      "

## 2024-09-11 NOTE — Discharge Summary (Deleted)
 " PROGRESS NOTE Sheryl Stone  FMW:[id number] DOB: Jul 24, 1956 DOA: 09/09/2024 PCP: Almarie Waddell NOVAK, NP  Brief Narrative/Hospital Course: [name] is a 68 y.o. female with PMH of CAD s/p stent in 2022, hypertension hyperlipidemia, anxiety disorder, type II diabetes mellitus, fibromyalgia presented to the ED 12/17 evening with acute abdominal pain severe in nature, with associated nausea, diarrhea and some abdominal distention. She endorses abdomen pain for many years at times worse with eating, also has fibromyalgia and gets leg pain on walking as well. She c/o sweating while in ambulance but no chest pain. Whever ehs gets frustrated or lifts something she gets chest pain too. Patient otherwise denies any chest pain, shortness of breath, fever, chills, headache, focal weakness, numbness tingling, speech difficulties.  Has mild pain now about 6-7/10 and has been 10 on admission and says it ha been 10 at home for several days In the ED at [facility]: CMP with mild hyponatremia mild transaminitis stable CBC COVID influenza flu negative troponin less than 15 elevated lactic acid 3.3 CXR no active disease.  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 Hemodynamically significant stenoses of the superior and inferior mesenteric artery origins. 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 No evidence of acute mesenteric ischemia. Peripheral vascular disease with hemodynamically significant lower  extremity arterial inflow disease as outlined above. In the setting of gluteal claudication or lower extremity arterovascular insufficiency, vascular consultation is recommended for further management. Patient received IV fluid hydration, heparin  drip Dr. Serene from vascular was consulted recommended [facility] evaluation, heparin  drip for anticoagulation, did not feel patient needed emergent surgical intervention. EDP discussed case with on-call PCCM Dr Layman who felt that patient was appropriate for admission to the Hospitalist service.  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 Magda 12/18 On DAPT, statin bowel regimen with senna/MiraLAX, monitor oral tolerance-given abdominal distention monitoring, increase ambulation, incentive she has decreased distention and having adequate flatus before discharge.  Potential discharge later today or tomorrow.  PVD with lower extremity arterial inflow disease: See CTA findings- [facility] is following currently on aspirin  Plavix    Hepatic steatosis/hepatomegaly: Monitor LFTs patient on statin po meds on hold-resume soon.  CAD w/p stent in 2022 Dr Rojelio at [facility] HTN H LD: Troponin unremarkable admission, endorses some chest pain on and off but with activity no chest pain and attributes her pain to fibromyalgia and anxiety. BP hypertensive on arrival. Last stress test in careeverywhere-02/14/22:No reversible ischemia or infarction.  Hypokinesis of the apex. Otherwise  normal limiting ir wall  motion.Left ventricular ejection fraction 71%>  EKG on admit a nsr, abnormal R wave progression. Repeat troponin negative on admission.  Check FLP.  Resume statin in a.m., resume home Toprol  hold lisinopril  Type 2 diabetes mellitus: Cont to hold home O[facility]- cont  q6 hr ssi.  Anxiety disorder Fibromyalgia: PTA on buspar , off xanax  recently, on tylenol  prn, gabapentin  and her regimen has been ineffective.  Resume home meds  DVT prophylaxis: SCD's Start: 09/10/24 2023SCD, Code Status:   Code Status: Full Code Family Communication: plan of care discussed with patient at bedside. Patient status is: Remains hospitalized because of severity of illness Level of care: Progressive   Dispo: The patient is from: HOME and husband at baseline.            Anticipated disposition: TBD Objective: Vitals last 24 hrs: Vitals:   09/10/24 1945 09/10/24 2016 09/10/24 2339 09/11/24 0303  BP: (!) 156/73 (!) 169/70 (!) 154/68 (!) 164/79  Pulse: 89  95 94  Resp: 19 (!) 21 19 19   Temp: 98.5 F (36.9 C) 98.3 F (36.8 C) 98.3 F (36.8 C) 98 F (36.7 C)  TempSrc:  Oral Oral Oral  SpO2: 91%  94% 96%  Weight:      Height:       Physical Examination: General exam: AA0X3 HEENT:Oral mucosa moist, Ear/Nose WNL grossly Respiratory system: Bilaterally clear BS,no use of accessory muscle Cardiovascular system: S1 & S2 +, No JVD. Gastrointestinal system: Abdomen soft,Tender on lower abdomen,ND, BS+ Nervous System: Alert, awake, moving all extremities,and following commands. Extremities: extremities cool but perfused Skin: Warm, no rashes MSK: Normal muscle bulk,tone, power   Medications reviewed:  Scheduled Meds:  aspirin  EC  81 mg Oral Daily   [START ON 09/12/2024] atorvastatin   80 mg Oral Daily   busPIRone   7.5 mg Oral TID   clopidogrel   75 mg Oral Q breakfast   ezetimibe   10 mg Oral Daily   gabapentin   100 mg Oral TID   insulin  aspart  0-9 Units Subcutaneous Q6H   metoprolol   succinate  25 mg Oral Daily   polyethylene glycol  17 g Oral Daily   sodium chloride  flush  3 mL Intravenous Q12H   Continuous Infusions:  sodium chloride  150 mL/hr at 09/11/24 0329   sodium chloride      Diet: Diet Order             Diet regular Room service appropriate? Yes; Fluid consistency: Thin  Diet effective now                    Data Reviewed: I have personally reviewed following labs and imaging studies ( see epic result tab) CBC: Recent Labs  Lab 09/09/24 1628 09/11/24 0415  WBC 8.2 8.0  HGB 13.3 10.7*  HCT 38.8 33.0*  MCV 93.7 99.4  PLT 373 349   CMP: Recent Labs  Lab 09/09/24 1543 09/11/24 0415  NA 132* 131*  K 4.3 4.1  CL 92* 96*  CO2 25 22  GLUCOSE 95 139*  BUN 8 9  CREATININE 0.73 0.80  CALCIUM  10.0 8.6*   GFR: Estimated Creatinine Clearance: 54.1 mL/min (by C-G formula based on SCr of 0.8 mg/dL). Recent Labs  Lab 09/09/24 1543 09/11/24 0415  AST 48* 48*  ALT 54* 50*  ALKPHOS 149* 112  BILITOT 0.4 0.3  PROT 8.1 6.6  ALBUMIN  4.9 4.3    Recent Labs  Lab 09/09/24 1543  LIPASE 32   No results for input(s): AMMONIA in the last 168 hours. Coagulation Profile: No results for input(s): INR, PROTIME in the last 168 hours. Unresulted Labs (From admission,  onward)     Start     Ordered   09/12/24 0500  CBC  Tomorrow morning,   R        09/11/24 0837   09/12/24 0500  Comprehensive metabolic panel with GFR  Tomorrow morning,   R        09/11/24 0837   09/12/24 0500  Lipid panel  Tomorrow morning,   R        09/11/24 0837           Antimicrobials/Microbiology: Anti-infectives (From admission, onward)    Start     Dose/Rate Route Frequency Ordered Stop   09/10/24 1415  cefoTEtan  (CEFOTAN ) 2 g in sodium chloride  0.9 % 100 mL IVPB        2 g 200 mL/hr over 30 Minutes Intravenous On call to O.R. 09/10/24 1357 09/11/24 0559   09/10/24 0045  cefTRIAXone  (ROCEPHIN ) 2 g in sodium chloride  0.9 % 100 mL IVPB        2 g 200 mL/hr  over 30 Minutes Intravenous  Once 09/10/24 0032 09/10/24 0140   09/10/24 0045  metroNIDAZOLE  (FLAGYL ) IVPB 500 mg        500 mg 100 mL/hr over 60 Minutes Intravenous  Once 09/10/24 0032 09/10/24 0305         Component Value Date/Time   SDES  09/09/2024 1418    URINE, CLEAN CATCH Performed at [facility], [address] [place], [place] [address]    [facility]  09/09/2024 1418    NONE Performed at [facility], [address] [place], [place] [address]    CULT (A) 09/09/2024 1418    <10,000 COLONIES/mL INSIGNIFICANT GROWTH Performed at [facility], [address] [place], [place] [address]    REPTSTATUS 09/11/2024 FINAL 09/09/2024 1418    Procedures: Procedures (LRB): AORTOGRAM (N/A) INSERTION, ENDOVASCULAR STENT GRAFT, SUPERIOR MESENTERIC ARTERY (N/A) LAPAROSCOPY, DIAGNOSTIC WITH LYSIS OF ADHESIONS ULTRASOUND GUIDANCE, FOR VASCULAR ACCESS   Mennie LAMY, MD [facility] 09/11/2024, 1:39 PM   "

## 2024-09-11 NOTE — Progress Notes (Signed)
 " PROGRESS NOTE Sheryl Stone  FMW:[id number] DOB: 1955/12/12 DOA: 09/09/2024 PCP: Almarie Waddell NOVAK, NP  Brief Narrative/Hospital Course: Sheryl Stone is a 68 y.o. female with PMH of CAD s/p stent in 2022, hypertension hyperlipidemia, anxiety disorder, type II diabetes mellitus, fibromyalgia presented to the ED 12/17 evening with acute abdominal pain severe in nature, with associated nausea, diarrhea and some abdominal distention. She endorses abdomen pain for many years at times worse with eating, also has fibromyalgia and gets leg pain on walking as well. She c/o sweating while in ambulance but no chest pain. Whever ehs gets frustrated or lifts something she gets chest pain too. Patient otherwise denies any chest pain, shortness of breath, fever, chills, headache, focal weakness, numbness tingling, speech difficulties.  Has mild pain now about 6-7/10 and has been 10 on admission and says it ha been 10 at home for several days In the ED at [facility]: CMP with mild hyponatremia mild transaminitis stable CBC COVID influenza flu negative troponin less than 15 elevated lactic acid 3.3 CXR no active disease.  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 Hemodynamically significant stenoses of the superior and inferior mesenteric artery origins. 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 No evidence of acute mesenteric ischemia. Peripheral vascular disease with hemodynamically significant lower  extremity arterial inflow disease as outlined above. In the setting of gluteal claudication or lower extremity arterovascular insufficiency, vascular consultation is recommended for further management. Patient received IV fluid hydration, heparin  drip Dr. Serene from vascular was consulted recommended [facility] evaluation, heparin  drip for anticoagulation, did not feel patient needed emergent surgical intervention. EDP discussed case with on-call PCCM Dr Layman who felt that patient was appropriate for admission to the Hospitalist service.  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 Magda 12/18 On DAPT, statin bowel regimen with senna/MiraLAX , monitor oral tolerance-given abdominal distention monitoring, increase ambulation, incentive she has decreased distention and having adequate flatus before discharge.  Potential discharge later today or tomorrow.  PVD with lower extremity arterial inflow disease: See CTA findings- VVS is following currently on aspirin  Plavix    Hepatic steatosis/hepatomegaly: Monitor LFTs patient on statin po meds on hold-resume soon.  CAD w/p stent in 2022 Dr Rojelio at [facility] HTN H LD: Troponin unremarkable admission, endorses some chest pain on and off but with activity no chest pain and attributes her pain to fibromyalgia and anxiety. BP hypertensive on arrival. Last stress test in careeverywhere-02/14/22:No reversible ischemia or infarction.  Hypokinesis of the apex. Otherwise  normal limiting ir wall  motion.Left ventricular ejection fraction 71%>  EKG on admit a nsr, abnormal R wave progression. Repeat troponin negative on admission.  Check FLP.  Resume statin in a.m., resume home Toprol  hold lisinopril  Type 2 diabetes mellitus: Cont to hold home [facility]- cont  q6 hr ssi.  Anxiety disorder Fibromyalgia: PTA on buspar , off xanax  recently, on tylenol  prn, gabapentin  and her regimen has been ineffective.  Resume home meds  DVT prophylaxis: SCD's Start: 09/10/24 2023SCD, Code Status:   Code Status: Full Code Family Communication: plan of care discussed with patient at bedside. Patient status is: Remains hospitalized because of severity of illness Level of care: Progressive   Dispo: The patient is from: HOME and husband at baseline.            Anticipated disposition: TBD Objective: Vitals last 24 hrs: Vitals:   09/10/24 1945 09/10/24 2016 09/10/24 2339 09/11/24 0303  BP: (!) 156/73 (!) 169/70 (!) 154/68 (!) 164/79  Pulse: 89  95 94  Resp: 19 (!) 21 19 19   Temp: 98.5 F (36.9 C) 98.3 F (36.8 C) 98.3 F (36.8 C) 98 F (36.7 C)  TempSrc:  Oral Oral Oral  SpO2: 91%  94% 96%  Weight:      Height:       Physical Examination: General exam: AA0X3 HEENT:Oral mucosa moist, Ear/Nose WNL grossly Respiratory system: Bilaterally clear BS,no use of accessory muscle Cardiovascular system: S1 & S2 +, No JVD. Gastrointestinal system: Abdomen soft,Tender on lower abdomen,ND, BS+ Nervous System: Alert, awake, moving all extremities,and following commands. Extremities: extremities cool but perfused Skin: Warm, no rashes MSK: Normal muscle bulk,tone, power   Medications reviewed:  Scheduled Meds:  aspirin  EC  81 mg Oral Daily   [START ON 09/12/2024] atorvastatin   80 mg Oral Daily   busPIRone   7.5 mg Oral TID   clopidogrel   75 mg Oral Q breakfast   ezetimibe   10 mg Oral Daily   gabapentin   100 mg Oral TID   insulin  aspart  0-9 Units Subcutaneous Q6H   metoprolol   succinate  25 mg Oral Daily   polyethylene glycol  17 g Oral Daily   sodium chloride  flush  3 mL Intravenous Q12H   Continuous Infusions:  sodium chloride  150 mL/hr at 09/11/24 0329   sodium chloride      Diet: Diet Order             Diet regular Room service appropriate? Yes; Fluid consistency: Thin  Diet effective now                    Data Reviewed: I have personally reviewed following labs and imaging studies ( see epic result tab) CBC: Recent Labs  Lab 09/09/24 1628 09/11/24 0415  WBC 8.2 8.0  HGB 13.3 10.7*  HCT 38.8 33.0*  MCV 93.7 99.4  PLT 373 349   CMP: Recent Labs  Lab 09/09/24 1543 09/11/24 0415  NA 132* 131*  K 4.3 4.1  CL 92* 96*  CO2 25 22  GLUCOSE 95 139*  BUN 8 9  CREATININE 0.73 0.80  CALCIUM  10.0 8.6*   GFR: Estimated Creatinine Clearance: 54.1 mL/min (by C-G formula based on SCr of 0.8 mg/dL). Recent Labs  Lab 09/09/24 1543 09/11/24 0415  AST 48* 48*  ALT 54* 50*  ALKPHOS 149* 112  BILITOT 0.4 0.3  PROT 8.1 6.6  ALBUMIN  4.9 4.3    Recent Labs  Lab 09/09/24 1543  LIPASE 32   No results for input(s): AMMONIA in the last 168 hours. Coagulation Profile: No results for input(s): INR, PROTIME in the last 168 hours. Unresulted Labs (From admission,  onward)     Start     Ordered   09/12/24 0500  CBC  Tomorrow morning,   R        09/11/24 0837   09/12/24 0500  Comprehensive metabolic panel with GFR  Tomorrow morning,   R        09/11/24 0837   09/12/24 0500  Lipid panel  Tomorrow morning,   R        09/11/24 0837           Antimicrobials/Microbiology: Anti-infectives (From admission, onward)    Start     Dose/Rate Route Frequency Ordered Stop   09/10/24 1415  cefoTEtan  (CEFOTAN ) 2 g in sodium chloride  0.9 % 100 mL IVPB        2 g 200 mL/hr over 30 Minutes Intravenous On call to O.R. 09/10/24 1357 09/11/24 0559   09/10/24 0045  cefTRIAXone  (ROCEPHIN ) 2 g in sodium chloride  0.9 % 100 mL IVPB        2 g 200 mL/hr  over 30 Minutes Intravenous  Once 09/10/24 0032 09/10/24 0140   09/10/24 0045  metroNIDAZOLE  (FLAGYL ) IVPB 500 mg        500 mg 100 mL/hr over 60 Minutes Intravenous  Once 09/10/24 0032 09/10/24 0305         Component Value Date/Time   SDES  09/09/2024 1418    URINE, CLEAN CATCH Performed at [facility], [address] [place], [place] [address]    [facility]  09/09/2024 1418    NONE Performed at [facility], [address] [place], [place] [address]    CULT (A) 09/09/2024 1418    <10,000 COLONIES/mL INSIGNIFICANT GROWTH Performed at [facility], [address] [place], [place] [address]    REPTSTATUS 09/11/2024 FINAL 09/09/2024 1418    Procedures: Procedures (LRB): AORTOGRAM (N/A) INSERTION, ENDOVASCULAR STENT GRAFT, SUPERIOR MESENTERIC ARTERY (N/A) LAPAROSCOPY, DIAGNOSTIC WITH LYSIS OF ADHESIONS ULTRASOUND GUIDANCE, FOR VASCULAR ACCESS  Mennie LAMY, MD [facility] 09/11/2024, 3:13 PM   "

## 2024-09-11 NOTE — Progress Notes (Signed)
 [facility] Progress Note  1 Day Post-Op  Subjective: CC:  Pain controlled. Reports flatus. No BM yet. Reports some blood on toilet paper after urination. Does endorse abdominal distention, denies nausea/vomiting. Ate small amt chicken and baked potato last night.  Objective: Vital signs in last 24 hours: Temp:  [98 F (36.7 C)-98.5 F (36.9 C)] 98 F (36.7 C) (12/19 0303) Pulse Rate:  [84-106] 94 (12/19 0303) Resp:  [13-22] 19 (12/19 0303) BP: (70-184)/(49-87) 164/79 (12/19 0303) SpO2:  [90 %-99 %] 96 % (12/19 0303) Arterial Line BP: (167-193)/(50-110) 193/59 (12/18 1930) Weight:  [62 kg] 62 kg (12/18 1343)    Intake/Output from previous day: 12/18 0701 - 12/19 0700 In: 2877.5 [I.V.:2577.5; IV Piggyback:300] Out: 340 [Urine:325; Blood:15] Intake/Output this shift: No intake/output data recorded.  PE: Gen:  Alert, NAD, pleasant Card:  Regular rate and rhythm Pulm:  Normal effort Abd: Soft, protuberant, moderately distended, overall non-tender, no guarding or rebound tenderness. laparoscopic port sites c/d/I. R groin site also looks clean with expanding hematoma.  Skin: warm and dry, no rashes  Psych: A&Ox3   Lab Results:  Recent Labs    09/09/24 1628 09/11/24 0415  WBC 8.2 8.0  HGB 13.3 10.7*  HCT 38.8 33.0*  PLT 373 349   BMET Recent Labs    09/09/24 1543 09/11/24 0415  NA 132* 131*  K 4.3 4.1  CL 92* 96*  CO2 25 22  GLUCOSE 95 139*  BUN 8 9  CREATININE 0.73 0.80  CALCIUM  10.0 8.6*   PT/INR No results for input(s): LABPROT, INR in the last 72 hours. CMP     Component Value Date/Time   NA 131 (L) 09/11/2024 0415   K 4.1 09/11/2024 0415   CL 96 (L) 09/11/2024 0415   CO2 22 09/11/2024 0415   GLUCOSE 139 (H) 09/11/2024 0415   BUN 9 09/11/2024 0415   CREATININE 0.80 09/11/2024 0415   CALCIUM  8.6 (L) 09/11/2024 0415   PROT 6.6 09/11/2024 0415   ALBUMIN  4.3 09/11/2024 0415   AST 48 (H) 09/11/2024 0415   ALT 50 (H) 09/11/2024 0415    ALKPHOS 112 09/11/2024 0415   BILITOT 0.3 09/11/2024 0415   GFRNONAA >60 09/11/2024 0415   GFRAA >60 09/23/2017 1450   Lipase     Component Value Date/Time   LIPASE 32 09/09/2024 1543    Studies/Results: HYBRID OR IMAGING ([facility] ONLY) Result Date: 09/10/2024 There is no interpretation for this exam.  This order is for images obtained during a surgical procedure.  Please See Surgeries Tab for more information regarding the procedure.   CT Angio Abd/Pel W and/or Wo Contrast Result Date: 09/09/2024 EXAM: CTA ABDOMEN AND PELVIS WITH AND WITHOUT CONTRAST 09/09/2024 09:02:02 PM TECHNIQUE: CTA images of the abdomen and pelvis with and without intravenous contrast. Three-dimensional MIP/volume rendered formations were performed. Automated exposure control, iterative reconstruction, and/or weight based adjustment of the mA/kV was utilized to reduce the radiation dose to as low as reasonably achievable. COMPARISON: Prior examination of 5:49 pm. CLINICAL HISTORY: severe abdominal pain. Mesenteric ischemia. FINDINGS: VASCULATURE: AORTA: Extensive aortoiliac atherosclerotic calcification. No aortic aneurysm or dissection. No hemodynamically significant stenosis. CELIAC TRUNK: There is some 50% stenosis of the celiac axis origin. Normal anatomic configuration. Note is made of an accessory left hepatic artery. No aneurysm or dissection. SUPERIOR MESENTERIC ARTERY: 75% stenosis of the superior mesenteric artery at its origin. Distally widely patent without aneurysmal dissection. RENAL ARTERIES: Single renal arteries bilaterally are widely patent and demonstrate normal  vascular morphology. No aneurysm or dissection. ILIAC ARTERIES: Multifocal 50% stenosis of the proximal right common iliac artery. Moderate calcific plaque within the right external iliac artery without hemodynamically significant stenosis. Right internal iliac artery is diseased with patent its origin. Focal 75% stenosis of the left common iliac  artery secondary to calcified atherosclerotic plaque. Less than 50% stenosis of the left external iliac artery secondary to calcified atherosclerotic plaque. New occlusion of the left internal iliac artery at its origin secondary to calcified plaque. LIVER: Moderate hepatic steatosis. Mild hepatomegaly. Correlation with liver enzymes may be helpful to exclude an obstructive or inflammatory process. GALLBLADDER AND BILE DUCTS: Status post cholecystectomy. No biliary ductal dilatation. SPLEEN: The spleen is unremarkable. PANCREAS: The pancreas is unremarkable. ADRENAL GLANDS: Bilateral adrenal glands demonstrate no acute abnormality. KIDNEYS, URETERS AND BLADDER: No stones in the kidneys or ureters. No hydronephrosis. No perinephric or periureteral stranding. Urinary bladder is unremarkable. 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 Distally patent. Moderate sigmoid diverticulosis without superimposed focal inflammatory change. No evidence of regional bowel ischemia. The stomach, small bowel, and large bowel are otherwise unremarkable. Appendix absent. REPRODUCTIVE: Status post hysterectomy. Complex mixed solid and cystic 4.5 cm right ovarian mass again identified. Gynecologic consultation is recommended for further management, as noted previously, given its interval increase in size since remote prior examination of September 23, 2017. PERITONEUM AND RETROPERITONEUM: No ascites or free air. LUNG BASE: No acute abnormality. LYMPH NODES: No lymphadenopathy. BONES AND SOFT TISSUES: No acute abnormality of the bones. No acute soft tissue abnormality. RAF SCORE: Aortic atherosclerosis (ICD10-I70.0) IMPRESSION: 1. 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 2. Hemodynamically significant stenoses of the superior  and inferior mesenteric artery origins. 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 No evidence of acute mesenteric ischemia. 3. Peripheral vascular disease with hemodynamically significant lower extremity arterial inflow disease as outlined above. In the setting of gluteal claudication or lower extremity arterovascular insufficiency, vascular consultation is recommended for further management. 4. Moderate hepatic steatosis and mild hepatomegaly. 5. 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 Gynecologic consultation is recommended for further evaluation. Electronically signed by: Dorethia Molt MD 09/09/2024 09:19 PM EST RP Workstation: [id number]   CT ABDOMEN PELVIS W CONTRAST Result Date: 09/09/2024 EXAM: CT ABDOMEN AND PELVIS WITH CONTRAST 09/09/2024 06:11:50 PM TECHNIQUE: CT of the abdomen and pelvis was performed with the administration of 100 mL of iohexol  (OMNIPAQUE ) 300 MG/ML solution. Multiplanar reformatted images are provided for review. Automated exposure control, iterative reconstruction, and/or weight-based adjustment of the mA/kV was utilized to reduce the radiation dose to as low as reasonably achievable. COMPARISON: Prior examinations of 12/19/2023 and 09/23/2017. CLINICAL HISTORY: Abdominal pain, acute, nonlocalized. FINDINGS: LOWER CHEST: No acute abnormality. LIVER: Moderate hepatic steatosis and hepatomegaly. GALLBLADDER AND BILE DUCTS: Status post cholecystectomy. Mild dilation of the biliary tree likely reflects post cholecystectomy change. Correlation with liver enzymes may be helpful to exclude an obstructive or inflammatory process. SPLEEN: No acute abnormality. PANCREAS: No acute abnormality. ADRENAL GLANDS: No acute abnormality. KIDNEYS, URETERS AND BLADDER: No stones in the kidneys or ureters. No  hydronephrosis. No perinephric or periureteral stranding. Urinary bladder is unremarkable. Particularly prominent atherosclerotic calcifications seen at the  origin of the right renal artery. If there is clinical evidence of hemodynamically significant renal artery stenosis, CT arteriography may be more helpful for further evaluation. GI AND BOWEL: Moderate sigmoid diverticulosis. Appendix absent. The stomach, small bowel, and large bowel are otherwise unremarkable. There is no bowel obstruction. PERITONEUM AND RETROPERITONEUM: No ascites. No free air. VASCULATURE: Extensive aortoiliac atherosclerotic calcifications. Particularly prominent atherosclerotic calcifications seen at the origin of the mesenteric and bilateral lower extremity arterial inflow. Peripheral vascular disease. If there is clinical evidence of chronic mesenteric ischemia or lower extremity claudication, CT arteriography may be more helpful for further evaluation. LYMPH NODES: No lymphadenopathy. REPRODUCTIVE ORGANS: Uterus is absent. Residual left ovaries unremarkable. 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 This mass measures 4.2 x 4.5 x 3.3 cm. Volume calculated as 32.7 cm. Gynecologic consultation is recommended for further evaluation. BONES AND SOFT TISSUES: No acute osseous abnormality. No focal soft tissue abnormality. RAF SCORE: Aortic atherosclerosis (ICD10-I70.0) IMPRESSION: 1. 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 2. Moderate hepatic steatosis and hepatomegaly. 3. Mild dilation of  the biliary tree likely reflects post cholecystectomy change, and correlation with liver enzymes may be helpful to exclude an obstructive or inflammatory process. 4. Peripheral vascular disease, and if there is clinical evidence of hemodynamically significant renal artery stenosis, chronic mesenteric ischemia, or lower extremity claudication, CT arteriography may be more helpful for further evaluation. 5. Moderate sigmoid diverticulosis without evidence of diverticulitis. 6. RAF score includes Aortic atherosclerosis (ICD10-I70.0). Electronically signed by: Dorethia Molt MD 09/09/2024 06:26 PM EST RP Workstation: [id number]   DG Chest Portable 1 View Result Date: 09/09/2024 CLINICAL DATA:  Shortness of breath and chest pain. EXAM: PORTABLE CHEST 1 VIEW COMPARISON:  None Available. FINDINGS: No focal consolidation, pleural effusion, or pneumothorax. Background of emphysema. Mild cardiomegaly. Atherosclerotic calcification of the aorta. No acute osseous pathology. IMPRESSION: 1. No active disease. 2. Mild cardiomegaly. Electronically Signed   By: Vanetta Chou M.D.   On: 09/09/2024 15:18    Anti-infectives: Anti-infectives (From admission, onward)    Start     Dose/Rate Route Frequency Ordered Stop   09/10/24 1415  cefoTEtan  (CEFOTAN ) 2 g in sodium chloride  0.9 % 100 mL IVPB        2 g 200 mL/hr over 30 Minutes Intravenous On call to O.R. 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 Magda - on DAPT, statin therapy S/p diagnostic laparoscopy 12/18 Dr. Teresa Afebrile, hemodynamically stable Having some bowel  function but remains distended.  Start bowel regimen with senna/miralax and monitor PO tolerance Not opposed to discharge later this afternoon (after lunch) but, given distention, would want  to be sure she is tolerating PO and having ongoing flatus.    LOS: 1 day    I reviewed Consultant vascular notes, last 24 h vitals and pain scores, last 48 h intake and output, last 24 h labs and trends, and last 24 h imaging results.  This care required straight-forward level of medical decision making.   Almarie Pringle, PA-C [facility] Please see Amion for pager number during day hours 7:00am-4:30pm

## 2024-09-12 DIAGNOSIS — R1010 Upper abdominal pain, unspecified: Secondary | ICD-10-CM

## 2024-09-12 DIAGNOSIS — R197 Diarrhea, unspecified: Secondary | ICD-10-CM

## 2024-09-12 LAB — COMPREHENSIVE METABOLIC PANEL WITH GFR
ALT: 147 U/L — ABNORMAL HIGH (ref 0–44)
AST: 181 U/L — ABNORMAL HIGH (ref 15–41)
Albumin: 4.2 g/dL (ref 3.5–5.0)
Alkaline Phosphatase: 140 U/L — ABNORMAL HIGH (ref 38–126)
Anion gap: 12 (ref 5–15)
BUN: 14 mg/dL (ref 8–23)
CO2: 27 mmol/L (ref 22–32)
Calcium: 9.1 mg/dL (ref 8.9–10.3)
Chloride: 96 mmol/L — ABNORMAL LOW (ref 98–111)
Creatinine, Ser: 0.82 mg/dL (ref 0.44–1.00)
GFR, Estimated: >60 mL/min
Glucose, Bld: 143 mg/dL — ABNORMAL HIGH (ref 70–99)
Potassium: 3.7 mmol/L (ref 3.5–5.1)
Sodium: 134 mmol/L — ABNORMAL LOW (ref 135–145)
Total Bilirubin: 0.4 mg/dL (ref 0.0–1.2)
Total Protein: 6.8 g/dL (ref 6.5–8.1)

## 2024-09-12 LAB — CBC
HCT: 34.5 % — ABNORMAL LOW (ref 36.0–46.0)
Hemoglobin: 11.6 g/dL — ABNORMAL LOW (ref 12.0–15.0)
MCH: 33.2 pg (ref 26.0–34.0)
MCHC: 33.6 g/dL (ref 30.0–36.0)
MCV: 98.9 fL (ref 80.0–100.0)
Platelets: 324 K/uL (ref 150–400)
RBC: 3.49 MIL/uL — ABNORMAL LOW (ref 3.87–5.11)
RDW: 13.8 % (ref 11.5–15.5)
WBC: 9.8 K/uL (ref 4.0–10.5)
nRBC: 0 % (ref 0.0–0.2)

## 2024-09-12 LAB — LIPID PANEL
Cholesterol: 158 mg/dL (ref 0–200)
HDL: 49 mg/dL
LDL Cholesterol: 79 mg/dL (ref 0–99)
Total CHOL/HDL Ratio: 3.2 ratio
Triglycerides: 149 mg/dL
VLDL: 30 mg/dL (ref 0–40)

## 2024-09-12 LAB — GLUCOSE, CAPILLARY
Glucose-Capillary: 110 mg/dL — ABNORMAL HIGH (ref 70–99)
Glucose-Capillary: 122 mg/dL — ABNORMAL HIGH (ref 70–99)
Glucose-Capillary: 123 mg/dL — ABNORMAL HIGH (ref 70–99)
Glucose-Capillary: 128 mg/dL — ABNORMAL HIGH (ref 70–99)
Glucose-Capillary: 129 mg/dL — ABNORMAL HIGH (ref 70–99)
Glucose-Capillary: 164 mg/dL — ABNORMAL HIGH (ref 70–99)

## 2024-09-12 MED ORDER — LACTATED RINGERS IV SOLN: INTRAVENOUS | Status: AC

## 2024-09-12 MED ORDER — LORATADINE 10 MG PO TABS
10.0000 mg | ORAL_TABLET | Freq: Every day | ORAL | Status: DC
  Administered 2024-09-12 – 2024-09-14 (×3): 10 mg via ORAL
  Filled 2024-09-12 (×3): qty 1

## 2024-09-12 MED ORDER — CALAMINE EX LOTN
1.0000 | TOPICAL_LOTION | Freq: Four times a day (QID) | CUTANEOUS | Status: DC | PRN
  Administered 2024-09-12 – 2024-09-13 (×2): 1 via TOPICAL
  Filled 2024-09-12: qty 177

## 2024-09-12 MED ORDER — NEOMYCIN-POLYMYXIN-DEXAMETH 3.5-10000-0.1 OP SUSP
1.0000 [drp] | OPHTHALMIC | Status: DC
  Administered 2024-09-12 – 2024-09-14 (×11): 1 [drp] via OPHTHALMIC
  Filled 2024-09-12 (×2): qty 5

## 2024-09-12 NOTE — Progress Notes (Signed)
 [facility] Progress Note  2 Days Post-Op  Subjective: CC:  Pain controlled. Reports flatus, some belching. Ate multiple meals yesterday, include a burger for dinner, without N/V. No BM yet.   Objective: Vital signs in last 24 hours: Temp:  [97.9 F (36.6 C)-98.9 F (37.2 C)] 98.9 F (37.2 C) (12/20 0755) Pulse Rate:  [89-112] 103 (12/20 0755) Resp:  [15-20] 15 (12/20 0755) BP: (104-173)/(66-87) 104/87 (12/20 0755) SpO2:  [83 %-100 %] 94 % (12/20 0755) Last BM Date : 09/08/24 (PTA)  Intake/Output from previous day: 12/19 0701 - 12/20 0700 In: 240 [P.O.:240] Out: 800 [Urine:800] Intake/Output this shift: No intake/output data recorded.  PE: Gen:  Alert, NAD, pleasant Card:  Regular rate and rhythm Pulm:  Normal effort Abd: Soft, protuberant, mild to moderate distention- improved compared to prior, overall non-tender, laparoscopic port sites c/d/I. R groin site also looks clean with mild ecchymosis Skin: warm and dry, no rashes  Psych: A&Ox3   Lab Results:  Recent Labs    09/11/24 0415 09/12/24 0348  WBC 8.0 9.8  HGB 10.7* 11.6*  HCT 33.0* 34.5*  PLT 349 324   BMET Recent Labs    09/11/24 0415 09/12/24 0348  NA 131* 134*  K 4.1 3.7  CL 96* 96*  CO2 22 27  GLUCOSE 139* 143*  BUN 9 14  CREATININE 0.80 0.82  CALCIUM  8.6* 9.1   PT/INR No results for input(s): LABPROT, INR in the last 72 hours. CMP     Component Value Date/Time   NA 134 (L) 09/12/2024 0348   K 3.7 09/12/2024 0348   CL 96 (L) 09/12/2024 0348   CO2 27 09/12/2024 0348   GLUCOSE 143 (H) 09/12/2024 0348   BUN 14 09/12/2024 0348   CREATININE 0.82 09/12/2024 0348   CALCIUM  9.1 09/12/2024 0348   PROT 6.8 09/12/2024 0348   ALBUMIN  4.2 09/12/2024 0348   AST 181 (H) 09/12/2024 0348   ALT 147 (H) 09/12/2024 0348   ALKPHOS 140 (H) 09/12/2024 0348   BILITOT 0.4 09/12/2024 0348   GFRNONAA >60 09/12/2024 0348   GFRAA >60 09/23/2017 1450   Lipase     Component Value  Date/Time   LIPASE 32 09/09/2024 1543    Studies/Results: HYBRID OR IMAGING ([facility] ONLY) Result Date: 09/10/2024 There is no interpretation for this exam.  This order is for images obtained during a surgical procedure.  Please See Surgeries Tab for more information regarding the procedure.    Anti-infectives: Anti-infectives (From admission, onward)    Start     Dose/Rate Route Frequency Ordered Stop   09/10/24 1415  cefoTEtan  (CEFOTAN ) 2 g in sodium chloride  0.9 % 100 mL IVPB        2 g 200 mL/hr over 30 Minutes Intravenous On call to O.R. 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 Magda - on DAPT, statin therapy S/p diagnostic laparoscopy 12/18 Dr. Teresa POD#2 Afebrile, hemodynamically stable Having flatus, remains distended but this is improved slighty compared to yesterday continue  bowel regimen with senna/miralax  and monitor PO tolerance While she does still have an ileus, this appears to be resolving. Ok for discharge if continues to have flatus and no nausea. She can follow up in our [facility] office as needed, does not need scheduled follow up as she had no acute intra-abdominal findings.     LOS: 2 days    I reviewed Consultant vascular notes, last 24 h vitals and pain scores, last 48 h intake and output, last 24 h labs and trends, and last 24 h imaging results.  This care required straight-forward level of medical decision making.   Almarie Pringle, PA-C [facility] Please see Amion for pager number during day hours 7:00am-4:30pm

## 2024-09-12 NOTE — Progress Notes (Addendum)
 " Progress Note   Patient: Sheryl Stone FMW:[id number] DOB: 09-20-1956 DOA: 09/09/2024     2 DOS: the patient was seen and examined on 09/12/2024   Brief hospital course: Aviela Blundell is a 68 y.o. female with PMH of CAD s/p stent in 2022, hypertension hyperlipidemia, anxiety disorder, type II diabetes mellitus, fibromyalgia presented to the ED 12/17 evening with acute abdominal pain severe in nature, with associated nausea, diarrhea and some abdominal distention. She endorses abdomen pain for many years at times worse with eating, also has fibromyalgia and gets leg pain on walking as well. She c/o sweating while in ambulance but no chest pain. Whever ehs gets frustrated or lifts something she gets chest pain too. Patient otherwise denies any chest pain, shortness of breath, fever, chills, headache, focal weakness, numbness tingling, speech difficulties.  Has mild pain now about 6-7/10 and has been 10 on admission and says it ha been 10 at home for several days In the ED at [facility]: CMP with mild hyponatremia mild transaminitis stable CBC COVID influenza flu negative troponin less than 15 elevated lactic acid 3.3 CXR no active disease.  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 Hemodynamically significant stenoses of the superior and inferior mesenteric artery origins. 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 No evidence of acute mesenteric ischemia. Peripheral vascular disease  with hemodynamically significant lower extremity arterial inflow disease as outlined above. In the setting of gluteal claudication or lower extremity arterovascular insufficiency, vascular consultation is recommended for further management. Patient received IV fluid hydration, heparin  drip Dr. Serene from vascular was consulted recommended [facility] evaluation, heparin  drip for anticoagulation, did not feel patient needed emergent surgical intervention. EDP discussed case with on-call PCCM Dr Layman who felt that patient was appropriate for admission to the Hospitalist service.  Patient arrived to floor on 12/18 afternoon Eager to go home  Assessment and Plan: Acute severe abdominal pain High-grade stenosis IMA >90%, SMA and IMA origin stenosis PVD Lactic acidosis: Patient presented with severe abdominal pain with lactic acidosis in the setting of stenosis high-grade as on CTA Admitted with heparin  drip, PCCM consulted and vascular surgery consult S/p diagnostic laparoscopy 12/18 and status post SMA angioplasty and stent by Dr. Magda 12/18 On DAPT, statin bowel regimen with senna/MiraLAX  -Still awaiting BM. Pos BS on exam. Per Gen Surg, can d/c if pt continues to have flatus and no nausea   PVD with lower extremity arterial inflow disease: See CTA findings- [facility] is following currently on aspirin  Plavix     Hepatic steatosis/hepatomegaly: LFT's trending up today -Reviewed initial CT abd with General Surgery. Pt is s/p remote cholecystectomy and doubt biliary obstruction -Pt does have hx of chronic liver disease. Suspect possible rise in LFT resulting from fluctuations in hemodynamics related to vascular procedure -Will cont on gentle IVF and recheck LFT in AM -If LFT continues to worsen, then consider formal GI consult   CAD w/p stent in 2022 Dr Rojelio at [facility] HTN H LD: Troponin unremarkable admission, endorses some chest pain on and off but with activity no chest pain and attributes her pain to  fibromyalgia and anxiety. BP hypertensive on arrival. Last stress test in careeverywhere-02/14/22:No reversible ischemia or infarction.  Hypokinesis of the apex. Otherwise normal limiting ir wall  motion.Left ventricular ejection fraction 71%>  EKG on admit a nsr, abnormal R wave progression. Repeat troponin negative on admission. Check FLP.  Resume statin in a.m., resume home Toprol  hold lisinopril   Type 2 diabetes mellitus: Cont to hold home OHA- cont  q6 hr ssi.   Anxiety disorder Fibromyalgia: PTA on buspar , off xanax  recently, on tylenol  prn, gabapentin         Subjective: Eager to go home. Pt still without BM when seen this AM  Physical Exam: Vitals:   09/11/24 2305 09/12/24 0308 09/12/24 0755 09/12/24 1205  BP: (!) 149/66 (!) 173/75 104/87 99/66  Pulse: (!) 102 89 (!) 103 93  Resp: 20 20 15 18   Temp: 98.6 F (37 C) 98 F (36.7 C) 98.9 F (37.2 C) 98.2 F (36.8 C)  TempSrc: Oral Oral Oral Oral  SpO2: (!) 83% 97% 94% 93%  Weight:      Height:       General exam: Awake, laying in bed, in nad Respiratory system: Normal respiratory effort, no wheezing Cardiovascular system: regular rate, s1, s2 Gastrointestinal system: Soft, nondistended, positive BS Central nervous system: CN2-12 grossly intact, strength intact Extremities: Perfused, no clubbing Skin: Normal skin turgor, no notable skin lesions seen Psychiatry: Mood normal // no visual hallucinations   Data Reviewed:  Labs reviewed: Na 134, K 3.7, Cr 0.82, WBC 9.8, Hgb 11.6, Plts 324  Family Communication: Pt in room, family at bedside  Disposition: Status is: Inpatient Remains inpatient appropriate because: severity of illness  Planned Discharge Destination: Home    Author: Garnette Pelt, MD 09/12/2024 2:50 PM  For on call review [email or web address].  "

## 2024-09-12 NOTE — Progress Notes (Addendum)
 [facility] night cross cover note:   I was notified by the patient's RN that the patient is experiencing some residual pruritus from a rash on her back torso and bilateral upper extremities that was reported to be noted on day shift, with this residual pruritus noted to be present following dose of benadryl  per existing order. VS appear stable, including afebrile, HR in the 80's to 90's . RR 20, and O2 sat in the high 90s on room air.  No evidence of hypotension throughout tonight's shift.  No report of any associated shortness of breath, difficulty swallowing, stridor, or wheezing.   I subsequently placed order for calamine lotion to be applied topically to affected areas every 6 hours as needed for pruritus.   Update: per pt request, I have also resumed her outpatient qdaily claritin , with next dose to occur now.     Eva Pore, DO Hospitalist

## 2024-09-12 NOTE — Plan of Care (Signed)
  Problem: Clinical Measurements: Goal: Will remain free from infection Outcome: Progressing   Problem: Nutrition: Goal: Adequate nutrition will be maintained Outcome: Progressing   Problem: Elimination: Goal: Will not experience complications related to bowel motility Outcome: Progressing Goal: Will not experience complications related to urinary retention Outcome: Progressing   Problem: Pain Managment: Goal: General experience of comfort will improve and/or be controlled Outcome: Progressing   Problem: Safety: Goal: Ability to remain free from injury will improve Outcome: Progressing

## 2024-09-13 DIAGNOSIS — K559 Vascular disorder of intestine, unspecified: Secondary | ICD-10-CM

## 2024-09-13 DIAGNOSIS — R7989 Other specified abnormal findings of blood chemistry: Secondary | ICD-10-CM | POA: Diagnosis not present

## 2024-09-13 DIAGNOSIS — R109 Unspecified abdominal pain: Secondary | ICD-10-CM | POA: Diagnosis not present

## 2024-09-13 LAB — COMPREHENSIVE METABOLIC PANEL WITH GFR
ALT: 449 U/L — ABNORMAL HIGH (ref 0–44)
AST: 681 U/L — ABNORMAL HIGH (ref 15–41)
Albumin: 4.1 g/dL (ref 3.5–5.0)
Alkaline Phosphatase: 224 U/L — ABNORMAL HIGH (ref 38–126)
Anion gap: 13 (ref 5–15)
BUN: 8 mg/dL (ref 8–23)
CO2: 26 mmol/L (ref 22–32)
Calcium: 9 mg/dL (ref 8.9–10.3)
Chloride: 98 mmol/L (ref 98–111)
Creatinine, Ser: 0.76 mg/dL (ref 0.44–1.00)
GFR, Estimated: >60 mL/min
Glucose, Bld: 134 mg/dL — ABNORMAL HIGH (ref 70–99)
Potassium: 4.4 mmol/L (ref 3.5–5.1)
Sodium: 136 mmol/L (ref 135–145)
Total Bilirubin: 0.6 mg/dL (ref 0.0–1.2)
Total Protein: 6.8 g/dL (ref 6.5–8.1)

## 2024-09-13 LAB — GLUCOSE, CAPILLARY
Glucose-Capillary: 133 mg/dL — ABNORMAL HIGH (ref 70–99)
Glucose-Capillary: 145 mg/dL — ABNORMAL HIGH (ref 70–99)
Glucose-Capillary: 148 mg/dL — ABNORMAL HIGH (ref 70–99)
Glucose-Capillary: 160 mg/dL — ABNORMAL HIGH (ref 70–99)
Glucose-Capillary: 172 mg/dL — ABNORMAL HIGH (ref 70–99)

## 2024-09-13 MED ORDER — FAMOTIDINE 20 MG PO TABS
20.0000 mg | ORAL_TABLET | Freq: Every day | ORAL | Status: DC
  Administered 2024-09-13 – 2024-09-14 (×2): 20 mg via ORAL
  Filled 2024-09-13 (×2): qty 1

## 2024-09-13 MED ORDER — ALPRAZOLAM 0.25 MG PO TABS
0.2500 mg | ORAL_TABLET | Freq: Once | ORAL | Status: AC
  Administered 2024-09-13: 0.25 mg via ORAL
  Filled 2024-09-13: qty 1

## 2024-09-13 MED ORDER — METHYLPREDNISOLONE SODIUM SUCC 40 MG IJ SOLR
40.0000 mg | Freq: Once | INTRAMUSCULAR | Status: AC
  Administered 2024-09-13: 40 mg via INTRAVENOUS
  Filled 2024-09-13: qty 1

## 2024-09-13 MED ORDER — FENTANYL CITRATE (PF) 50 MCG/ML IJ SOSY
50.0000 ug | PREFILLED_SYRINGE | INTRAMUSCULAR | Status: DC | PRN
  Administered 2024-09-13 – 2024-09-14 (×4): 50 ug via INTRAVENOUS
  Filled 2024-09-13 (×4): qty 1

## 2024-09-13 MED ORDER — MENTHOL 3 MG MT LOZG
1.0000 | LOZENGE | OROMUCOSAL | Status: DC | PRN
  Administered 2024-09-13: 3 mg via ORAL
  Filled 2024-09-13: qty 9

## 2024-09-13 NOTE — Consult Note (Signed)
 Reason for Consult: Elevated liver enzymes Referring Physician: [facility]  Emelynn Rance HPI: This is 68 year old female with a PMH of DM, HTN, and atherosclerosis admitted with acute abdominal pain.  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  Dr. Magda of Vascular Surgery, performed stenting of the SMA with good flow.  Dr. Teresa performed a diagnostic laparoscopy to exclude any vascular compromise of her intestines.  She reported feeling well post intervention and her abdominal pain nearly resolved.  She was able to tolerate PO without any significant pain the following days.  However, today she ate eggs and another breakfast item which caused her pain.  Eating these types of foods, per her report does cause pain.  The blood work did show a marked elevation of her liver enzymes:  AST 681, ALT 449, and AP 22.  Since admission her liver enzymes were noted to be increasing.  Before this admission her liver enzymes were normal, however, on 12/19/2023 she had marked elevations in her liver enzymes:  AST 708, ALT 973, and AP 212.  At that time she presented with hypotension and AKI.  She had diarrhea and she was also diagnosed with an UTI.  She was provided with IV hydration and then discharged home.  Blood work in [employer] on 03/01/2024 showed normalization of her liver enzymes.  The recent CTA shows hepatomegaly and steatosis, but no obvious signs of cirrhosis.  Past Medical History:  Diagnosis Date   Anxiety and depression    Back pain    Diabetes mellitus without complication (HCC)    Fibromyalgia    Hypertension    Panic attacks     Past Surgical History:  Procedure Laterality Date   ABDOMINAL AORTIC ENDOVASCULAR STENT GRAFT N/A 09/10/2024   Procedure: INSERTION, ENDOVASCULAR STENT GRAFT, SUPERIOR MESENTERIC ARTERY;  Surgeon: Magda Debby SAILOR, MD;  Location: [facility] OR;  Service: Vascular;   Laterality: N/A;  SMA INTERVENTION   AORTOGRAM N/A 09/10/2024   Procedure: AORTOGRAM;  Surgeon: Magda Debby SAILOR, MD;  Location: [facility] OR;  Service: Vascular;  Laterality: N/A;   LAPAROSCOPY  09/10/2024   Procedure: LAPAROSCOPY, DIAGNOSTIC WITH LYSIS OF ADHESIONS;  Surgeon: Teresa Lonni HERO, MD;  Location: [facility] OR;  Service: General;;   PARTIAL HYSTERECTOMY     ULTRASOUND GUIDANCE FOR VASCULAR ACCESS  09/10/2024   Procedure: ULTRASOUND GUIDANCE, FOR VASCULAR ACCESS;  Surgeon: Magda Debby SAILOR, MD;  Location: [facility] OR;  Service: Vascular;;    History reviewed. No pertinent family history.  Social History:  reports that she has quit smoking. She has never used smokeless tobacco. She reports that she does not drink alcohol and does not use drugs.  Allergies: Allergies[1]  Medications: Scheduled:  aspirin  EC  81 mg Oral Daily   atorvastatin   80 mg Oral Daily   busPIRone   7.5 mg Oral TID   clopidogrel   75 mg Oral Q breakfast   ezetimibe   10 mg Oral Daily   famotidine   20 mg Oral Daily   gabapentin   100 mg Oral TID   insulin  aspart  0-9 Units Subcutaneous Q6H   loratadine   10 mg Oral Daily   metoprolol  succinate  25 mg Oral Daily   neomycin -polymyxin b-dexamethasone   1 drop Both Eyes Q4H   sodium chloride  flush  3 mL Intravenous Q12H   Continuous:  lactated ringers  Stopped (09/13/24 1004)    Results for orders placed or  performed during the hospital encounter of 09/09/24 (from the past 24 hours)  Glucose, capillary     Status: Abnormal   Collection Time: 09/12/24 12:59 PM  Result Value Ref Range   Glucose-Capillary 110 (H) 70 - 99 mg/dL  Glucose, capillary     Status: Abnormal   Collection Time: 09/12/24  4:47 PM  Result Value Ref Range   Glucose-Capillary 122 (H) 70 - 99 mg/dL  Glucose, capillary     Status: Abnormal   Collection Time: 09/12/24  7:51 PM  Result Value Ref Range   Glucose-Capillary 164 (H) 70 - 99 mg/dL  Glucose, capillary     Status: Abnormal   Collection Time:  09/13/24 12:31 AM  Result Value Ref Range   Glucose-Capillary 133 (H) 70 - 99 mg/dL  Comprehensive metabolic panel with GFR     Status: Abnormal   Collection Time: 09/13/24  3:16 AM  Result Value Ref Range   Sodium 136 135 - 145 mmol/L   Potassium 4.4 3.5 - 5.1 mmol/L   Chloride 98 98 - 111 mmol/L   CO2 26 22 - 32 mmol/L   Glucose, Bld 134 (H) 70 - 99 mg/dL   BUN 8 8 - 23 mg/dL   Creatinine, Ser 9.23 0.44 - 1.00 mg/dL   Calcium  9.0 8.9 - 10.3 mg/dL   Total Protein 6.8 6.5 - 8.1 g/dL   Albumin  4.1 3.5 - 5.0 g/dL   AST 318 (H) 15 - 41 U/L   ALT 449 (H) 0 - 44 U/L   Alkaline Phosphatase 224 (H) 38 - 126 U/L   Total Bilirubin 0.6 0.0 - 1.2 mg/dL   GFR, Estimated >39 >39 mL/min   Anion gap 13 5 - 15  Glucose, capillary     Status: Abnormal   Collection Time: 09/13/24  6:17 AM  Result Value Ref Range   Glucose-Capillary 160 (H) 70 - 99 mg/dL   Comment 1 Notify RN    Comment 2 Document in Chart      No results found.  ROS:  As stated above in the HPI otherwise negative.  Blood pressure 119/86, pulse 94, temperature 98.2 F (36.8 C), temperature source Oral, resp. rate 19, height 5' 3 (1.6 m), weight 62 kg, SpO2 94%.    PE: Gen: NAD, Alert and Oriented HEENT:  [place]/AT, EOMI Neck: Supple, no LAD Lungs: CTA Bilaterally CV: RRR without M/G/R ABD: Soft, NTND, +BS Ext: No C/C/E  Assessment/Plan: 1) Ischemic hepatopathy. 2) MASLD. 3) DM. 4) Atherosclerosis.   On 09/10/2024 her blood pressure readings were as follows:  97/67 mmHg (1343), 76/60 mmHg (1830) and 70/49 mmHg (1845).  These readings were all around the time of her intervention and post procedure.  There was some evidence of a mild hypotension at the time of the intervention, however, the marked reduction of her blood pressure is believed to be the source of the continued upward trend.  This is also consistent with her ischemic hepatopathy in March this year.  Clinically she is well and without any pain.  It is not known  if her liver enzymes have peaked.  The patient tested negative for HAV, HBV, and HCV in March this year.  Plan: 1) Supportive care. 2) Avoid excessive hypotension. 3) Trend liver panel. 4) [facility] to assume care in the AM.  Shateria Paternostro D 09/13/2024, 10:57 AM        [1]  Allergies Allergen Reactions   Latex Itching   Codeine Other (See Comments)  constipation   Dextromethorphan Other (See Comments)    hyper   Diazepam Other (See Comments)    Itching   Doxycycline Other (See Comments)    Unknown   Elemental Sulfur Other (See Comments)    Unknown    Hydromorphone  Itching   Moxifloxacin Diarrhea   Nitrofurantoin Other (See Comments)     Unknown   Penicillins Itching    Itching & constipation 09/10/24 tolerated cefazolin     Propoxyphene Other (See Comments)    constipation   Pseudoephedrine Other (See Comments)     excess energy   Sulfa Antibiotics Itching    Itching & constipation   Sulfamethoxazole Other (See Comments)    Unknown   Sulfur Other (See Comments)    Unknown    Tetracycline Hcl Other (See Comments)     Unknown   Tramadol Other (See Comments)    Hyper sedation    Morphine Dermatitis    Itching & constipation

## 2024-09-13 NOTE — Progress Notes (Signed)
 [facility] night cross cover note:   I was notified by the patient's RN that the patient continues to complain of erythematous rash involving her back, upper chest, and b/l upper extremities associated with ongoing pruritus.  I evaluated the patient at bedside, who conveyed that she has had a persistent, as opposed to intermittent, or waxing and waning, and spite of interval Claritin , as needed Benadryl .  No benefit from prn calamine lotion. not associated with any lip or tongue swelling nor any difficulty swallowing.   Rash first noted on 12/19 after being admitted for mesenteric ischemia and undergoing VBX stent to SMA on 12/18.   Per chart review, patient has a history of allergy to morphine, documented to be associated with erythematous/pruritic rash.  In this setting, I will discontinue existing order for as needed IV Dilaudid , and replace with as needed IV fentanyl .  Will also order to single dose of IV solumedrol, as well as a dose of famotidine .   Additionally, dose of Xanax , noting that she received a dose of this medication earlier in this hospitalization, which she conveys was helpful in addressing her anxiety.   Vital signs appear stable, without any evidence of hypotension.  Afebrile.  Heart rates in the 80s to 90s.  Respiratory rate 16-17, and oxygen saturation 98% on room air.     Eva Pore, DO Hospitalist

## 2024-09-13 NOTE — Plan of Care (Signed)

## 2024-09-13 NOTE — Progress Notes (Signed)
 "  PROGRESS NOTE    Sheryl Stone  FMW:[id number] DOB: 12-27-1955 DOA: 09/09/2024 PCP: Almarie Waddell NOVAK, NP   Brief Narrative: Sheryl Stone is a 68 y.o. female with a history of hypertension, CAD, hyperlipidemia, anxiety, diabetes mellitus type 2, fibromyalgia.  Patient presented secondary to abdominal pain and found to have evidence of high grade inferior mesenteric artery stenosis in addition to severe/ hemodynamically significant stenosis of the SMA and IMA origin. Vascular surgery and general surgery consulted. Patient underwent a diagnostic laparoscopy in addition to superior mesenteric artery angioplasty and stenting on 12/18 with improvement in symptoms. Diet advanced successfully. Hospitalization further complicated by development of worsening liver enzymes.   Assessment and Plan:  High-grade IMA stenosis SMA/IMA stenosis Mesenteric ischemia Patient with associated abdominal pain. Vascular and general surgery consulted. Patient underwent a diagnostic laparoscopy with lysis of adhesions, in addition to SMA angioplasty and stenting, on 12/18. Patient with improved abdominal symptoms.  Lactic acidosis Secondary to mesenteric ischemia. Resolved with IV fluids and treatment of SMA occlusion.  Elevated LFTs Significant AST and ALT rise. Possibly related to hemodynamics during acute illness. CT imaging is significant for hepatic steatosis. GI consulted on 12/21. - GI recommendations: trend CMP  Hepatic steatosis Hepatomegaly Noted on CT imaging.  PAD -Continue aspirin  and Plavix   CAD -Continue aspirin  and Plavix   Primary hypertension -Continue Toprol  XL  Hyperlipidemia -Continue Zetia   Diabetes mellitus type 2 Prior to arrival medication(s) includes metformin . Well-controlled based on hemoglobin A1C of 7.0%. -Continue SSI  Anxiety -Continue BuSpar   Fibromyalgia Noted.   DVT prophylaxis: SCDs Code Status:   Code Status: Full Code Family Communication:  Husband at bedside Disposition Plan: Discharge home likely in 1-2 days pending improvement of LFTs   Consultants:  Vascular surgery General surgery Gastroenterology  Procedures:  General surgery procedure (12/18): Diagnostic laparoscopy, lysis of adhesions Vascular surgery procedure (12/18): Ultrasound guided right common femoral artery access  Aortogram  Superior mesenteric artery selective angiogram  Superior mesenteric artery angioplasty and stenting (6x49mm VBX)  Antimicrobials: Ceftriaxone  Flagyl     Subjective: Patient with some abdominal pain, otherwise no issues overnight. Unhappy about development of worsening LFTs and need to stay admitted.  Objective: BP 119/86 (BP Location: Right Arm)   Pulse 94   Temp 98.2 F (36.8 C) (Oral)   Resp 19   Ht 5' 3 (1.6 m)   Wt 62 kg   SpO2 94%   BMI 24.23 kg/m   Examination:  General exam: Appears calm and comfortable. Respiratory system: Clear to auscultation. Respiratory effort normal. Cardiovascular system: S1 & S2 heard, RRR. No murmur. Gastrointestinal system: Abdomen is mildly distended, soft and mildly tender in RLQ. Normal bowel sounds heard. Central nervous system: Alert and oriented. No focal neurological deficits. Musculoskeletal: No edema. No calf tenderness Skin: No cyanosis. No rashes Psychiatry: Judgement and insight appear normal. Mood & affect appropriate.    Data Reviewed: I have personally reviewed following labs and imaging studies  CBC Lab Results  Component Value Date   WBC 9.8 09/12/2024   RBC 3.49 (L) 09/12/2024   HGB 11.6 (L) 09/12/2024   HCT 34.5 (L) 09/12/2024   MCV 98.9 09/12/2024   MCH 33.2 09/12/2024   PLT 324 09/12/2024   MCHC 33.6 09/12/2024   RDW 13.8 09/12/2024   LYMPHSABS 2.8 05/05/2024   MONOABS 0.5 05/05/2024   EOSABS 0.2 05/05/2024   BASOSABS 0.1 05/05/2024     Last metabolic panel Lab Results  Component Value Date   NA 136 09/13/2024  K 4.4 09/13/2024   CL 98  09/13/2024   CO2 26 09/13/2024   BUN 8 09/13/2024   CREATININE 0.76 09/13/2024   GLUCOSE 134 (H) 09/13/2024   GFRNONAA >60 09/13/2024   GFRAA >60 09/23/2017   CALCIUM  9.0 09/13/2024   PROT 6.8 09/13/2024   ALBUMIN  4.1 09/13/2024   BILITOT 0.6 09/13/2024   ALKPHOS 224 (H) 09/13/2024   AST 681 (H) 09/13/2024   ALT 449 (H) 09/13/2024   ANIONGAP 13 09/13/2024    GFR: Estimated Creatinine Clearance: 54.1 mL/min (by C-G formula based on SCr of 0.76 mg/dL).  Recent Results (from the past 240 hours)  Urine Culture     Status: Abnormal   Collection Time: 09/09/24  2:18 PM   Specimen: Urine, Clean Catch  Result Value Ref Range Status   Specimen Description   Final    URINE, CLEAN CATCH Performed at [facility], [address] [place], [place] [address]    Special Requests   Final    NONE Performed at [facility], [address] [place], [place] [address]    Culture (A)  Final    <10,000 COLONIES/mL INSIGNIFICANT GROWTH Performed at [facility], [address], [place], [place] [address]    Report Status 09/11/2024 FINAL  Final  Resp panel by RT-PCR (RSV, Flu A&B, Covid) Anterior Nasal Swab     Status: None   Collection Time: 09/09/24  3:43 PM   Specimen: Anterior Nasal Swab  Result Value Ref Range Status   SARS Coronavirus 2 by RT PCR NEGATIVE NEGATIVE Final    Comment: (NOTE) SARS-CoV-2 target nucleic acids are NOT DETECTED.  The SARS-CoV-2 RNA is generally detectable in upper respiratory specimens during the acute phase of infection. The lowest concentration of SARS-CoV-2 viral copies this assay can detect is 138 copies/mL. A negative result does not preclude SARS-Cov-2 infection and should not be used as the sole basis for treatment or other patient management decisions. A negative result may occur with  improper specimen collection/handling, submission of specimen other than nasopharyngeal swab, presence of viral mutation(s) within  the areas targeted by this assay, and inadequate number of viral copies(<138 copies/mL). A negative result must be combined with clinical observations, patient history, and epidemiological information. The expected result is Negative.  Fact Sheet for Patients:  bloggercourse.com  Fact Sheet for Healthcare Providers:  seriousbroker.it  This test is no t yet approved or cleared by the [place]  FDA and  has been authorized for detection and/or diagnosis of SARS-CoV-2 by FDA under an Emergency Use Authorization (EUA). This EUA will remain  in effect (meaning this test can be used) for the duration of the COVID-19 declaration under Section 564(b)(1) of the Act, 21 U.S.C.section 360bbb-3(b)(1), unless the authorization is terminated  or revoked sooner.       Influenza A by PCR NEGATIVE NEGATIVE Final   Influenza B by PCR NEGATIVE NEGATIVE Final    Comment: (NOTE) The Xpert Xpress SARS-CoV-2/FLU/RSV plus assay is intended as an aid in the diagnosis of influenza from Nasopharyngeal swab specimens and should not be used as a sole basis for treatment. Nasal washings and aspirates are unacceptable for Xpert Xpress SARS-CoV-2/FLU/RSV testing.  Fact Sheet for Patients: bloggercourse.com  Fact Sheet for Healthcare Providers: seriousbroker.it  This test is not yet approved or cleared by the [place]  FDA and has been authorized for detection and/or diagnosis of SARS-CoV-2 by FDA under an Emergency Use Authorization (EUA). This  EUA will remain in effect (meaning this test can be used) for the duration of the COVID-19 declaration under Section 564(b)(1) of the Act, 21 U.S.C. section 360bbb-3(b)(1), unless the authorization is terminated or revoked.     Resp Syncytial Virus by PCR NEGATIVE NEGATIVE Final    Comment: (NOTE) Fact Sheet for  Patients: bloggercourse.com  Fact Sheet for Healthcare Providers: seriousbroker.it  This test is not yet approved or cleared by the [place]  FDA and has been authorized for detection and/or diagnosis of SARS-CoV-2 by FDA under an Emergency Use Authorization (EUA). This EUA will remain in effect (meaning this test can be used) for the duration of the COVID-19 declaration under Section 564(b)(1) of the Act, 21 U.S.C. section 360bbb-3(b)(1), unless the authorization is terminated or revoked.  Performed at [facility], [address], [place], [place] [address]   Surgical pcr screen     Status: None   Collection Time: 09/10/24  1:01 PM   Specimen: Nasal Mucosa; Nasal Swab  Result Value Ref Range Status   MRSA, PCR NEGATIVE NEGATIVE Final   Staphylococcus aureus NEGATIVE NEGATIVE Final    Comment: (NOTE) The Xpert SA Assay (FDA approved for NASAL specimens in patients 27 years of age and older), is one component of a comprehensive surveillance program. It is not intended to diagnose infection nor to guide or monitor treatment. Performed at [facility], [address] [place], [place] [address]       Radiology Studies: No results found.    LOS: 3 days    Elgin Lam, MD [facility] 09/13/2024, 10:32 AM   If 7PM-7AM, please contact night-coverage [email or web address]  "

## 2024-09-14 DIAGNOSIS — K7589 Other specified inflammatory liver diseases: Secondary | ICD-10-CM

## 2024-09-14 DIAGNOSIS — R748 Abnormal levels of other serum enzymes: Secondary | ICD-10-CM | POA: Diagnosis not present

## 2024-09-14 DIAGNOSIS — K7200 Acute and subacute hepatic failure without coma: Secondary | ICD-10-CM

## 2024-09-14 DIAGNOSIS — K559 Vascular disorder of intestine, unspecified: Secondary | ICD-10-CM | POA: Diagnosis not present

## 2024-09-14 DIAGNOSIS — R109 Unspecified abdominal pain: Secondary | ICD-10-CM | POA: Diagnosis not present

## 2024-09-14 LAB — COMPREHENSIVE METABOLIC PANEL WITH GFR
ALT: 274 U/L — ABNORMAL HIGH (ref 0–44)
AST: 151 U/L — ABNORMAL HIGH (ref 15–41)
Albumin: 4.6 g/dL (ref 3.5–5.0)
Alkaline Phosphatase: 207 U/L — ABNORMAL HIGH (ref 38–126)
Anion gap: 13 (ref 5–15)
BUN: 11 mg/dL (ref 8–23)
CO2: 27 mmol/L (ref 22–32)
Calcium: 9.5 mg/dL (ref 8.9–10.3)
Chloride: 96 mmol/L — ABNORMAL LOW (ref 98–111)
Creatinine, Ser: 0.79 mg/dL (ref 0.44–1.00)
GFR, Estimated: >60 mL/min
Glucose, Bld: 140 mg/dL — ABNORMAL HIGH (ref 70–99)
Potassium: 3.5 mmol/L (ref 3.5–5.1)
Sodium: 136 mmol/L (ref 135–145)
Total Bilirubin: 0.5 mg/dL (ref 0.0–1.2)
Total Protein: 7.8 g/dL (ref 6.5–8.1)

## 2024-09-14 LAB — GLUCOSE, CAPILLARY: Glucose-Capillary: 123 mg/dL — ABNORMAL HIGH (ref 70–99)

## 2024-09-14 MED ORDER — POTASSIUM CHLORIDE CRYS ER 20 MEQ PO TBCR
40.0000 meq | EXTENDED_RELEASE_TABLET | Freq: Once | ORAL | Status: AC
  Administered 2024-09-14: 40 meq via ORAL
  Filled 2024-09-14: qty 2

## 2024-09-14 NOTE — Discharge Summary (Signed)
 " Physician Discharge Summary   Patient: Sheryl Stone MRN: [id number] DOB: 27-Feb-1956  Admit date:     09/09/2024  Discharge date: 09/14/2024  Discharge Physician: Elgin Lam, MD   PCP: Almarie Waddell NOVAK, NP   Recommendations at discharge:  PCP visit for hospital follow-up General surgery visit for hospital follow-up  Discharge Diagnoses: Principal Problem:   Abdominal pain Active Problems:   Ischemic hepatitis  Resolved Problems:   * No resolved hospital problems. *  Hospital Course: Sheryl Stone is a 68 y.o. female with a history of hypertension, CAD, hyperlipidemia, anxiety, diabetes mellitus type 2, fibromyalgia.  Patient presented secondary to abdominal pain and found to have evidence of high grade inferior mesenteric artery stenosis in addition to severe/ hemodynamically significant stenosis of the SMA and IMA origin. Vascular surgery and general surgery consulted. Patient underwent a diagnostic laparoscopy in addition to superior mesenteric artery angioplasty and stenting on 12/18 with improvement in symptoms. Diet advanced successfully. Hospitalization further complicated by development of worsening liver enzymes which peaked and improved prior to discharge.  Assessment and Plan:  High-grade IMA stenosis SMA/IMA stenosis Mesenteric ischemia Patient with associated abdominal pain. Vascular and general surgery consulted. Patient underwent a diagnostic laparoscopy with lysis of adhesions, in addition to SMA angioplasty and stenting, on 12/18. Patient with improved abdominal symptoms.   Lactic acidosis Secondary to mesenteric ischemia. Resolved with IV fluids and treatment of SMA occlusion.   Elevated LFTs Significant AST and ALT rise. Possibly related to hemodynamics during acute illness. CT imaging is significant for hepatic steatosis. GI consulted on 12/21 and recommended continued observation. Patient's AST/ALT peaked at 681/449 prior to improving. Called patient  to let her know to hold her Lipitor until she follows up with her doctor.    Hepatic steatosis Hepatomegaly Noted on CT imaging.   PAD Continue aspirin  and Plavix . Hold Lipitor.   CAD Continue aspirin  and Plavix    Primary hypertension Continue Toprol  XL   Hyperlipidemia Continue Zetia . Hold Lipitor.   Diabetes mellitus type 2 Prior to arrival medication(s) includes metformin . Well-controlled based on hemoglobin A1C of 7.0%. Continue home metformin .   Anxiety Continue BuSpar .   Fibromyalgia Noted.   Consultants:  Vascular surgery General surgery Gastroenterology   Procedures:  General surgery procedure (12/18): Diagnostic laparoscopy, lysis of adhesions Vascular surgery procedure (12/18): Ultrasound guided right common femoral artery access  Aortogram  Superior mesenteric artery selective angiogram  Superior mesenteric artery angioplasty and stenting (6x14mm VBX)  Disposition: Home Diet recommendation: Carb modified diet   DISCHARGE MEDICATION: Allergies as of 09/14/2024       Reactions   Latex Itching   Codeine Other (See Comments)   constipation   Dextromethorphan Other (See Comments)   hyper   Diazepam Other (See Comments)   Itching   Doxycycline Other (See Comments)   Unknown   Elemental Sulfur Other (See Comments)   Unknown    Hydromorphone  Itching   Moxifloxacin Diarrhea   Nitrofurantoin Other (See Comments)    Unknown   Penicillins Itching   Itching & constipation 09/10/24 tolerated cefazolin    Propoxyphene Other (See Comments)   constipation   Pseudoephedrine Other (See Comments)    excess energy   Sulfa Antibiotics Itching   Itching & constipation   Sulfamethoxazole Other (See Comments)   Unknown   Sulfur Other (See Comments)   Unknown    Tetracycline Hcl Other (See Comments)    Unknown   Tramadol Other (See Comments)   Hyper sedation   Morphine Dermatitis  Itching & constipation        Medication List     PAUSE  taking these medications    atorvastatin  80 MG tablet Wait to take this until your doctor or other care provider tells you to start again. Commonly known as: LIPITOR Take 80 mg by mouth daily.       STOP taking these medications    ALPRAZolam  0.5 MG tablet Commonly known as: XANAX    fluconazole  150 MG tablet Commonly known as: Diflucan        TAKE these medications    aspirin  81 MG tablet Take 81 mg by mouth daily.   busPIRone  5 MG tablet Commonly known as: BUSPAR  TAKE 1 & 1/2 (ONE & ONE-HALF) TABLETS BY MOUTH TWICE DAILY What changed: See the new instructions.   clopidogrel  75 MG tablet Commonly known as: PLAVIX  Take 1 tablet (75 mg total) by mouth daily with breakfast.   [employer] VIT D 5000 HIGH-POTENCY PO Take 5,000 Units by mouth daily.   dicyclomine  20 MG tablet Commonly known as: BENTYL  Take 1 tablet by mouth twice daily   ezetimibe  10 MG tablet Commonly known as: ZETIA  Take 1 tablet (10 mg total) by mouth daily.   gabapentin  100 MG capsule Commonly known as: NEURONTIN  Take 1 capsule (100 mg total) by mouth 3 (three) times daily.   lisinopril 5 MG tablet Commonly known as: ZESTRIL Take 5 mg by mouth daily.   metFORMIN  500 MG 24 hr tablet Commonly known as: GLUCOPHAGE -XR Take 2 tablets (1,000 mg total) by mouth daily with breakfast.   metoprolol  succinate 25 MG 24 hr tablet Commonly known as: TOPROL -XL Take 1 tablet (25 mg total) by mouth daily. Take with or immediately following a meal   multivitamin tablet Take 1 tablet by mouth daily.   ondansetron  4 MG disintegrating tablet Commonly known as: ZOFRAN -ODT Take 1 tablet (4 mg total) by mouth every 8 (eight) hours as needed for nausea or vomiting.   pantoprazole  20 MG tablet Commonly known as: PROTONIX  Take 2 tablets (40 mg total) by mouth daily.   polyethylene glycol 17 g packet Commonly known as: MIRALAX  / GLYCOLAX  Take 17 g by mouth daily as needed for mild constipation or moderate  constipation.        Follow-up Information     [facility] at [facility] Follow up in 4 week(s).   Specialty: Vascular Surgery Why: Office will call to arrange your appt(s) Contact information: [address] [place]  [address] [phone]        Almarie Birmingham B, NP Follow up in 1 week(s).   Specialties: Family Medicine, Emergency Medicine Contact information: [address] [place] [address] [phone]                Discharge Exam: BP 109/77 (BP Location: Left Arm)   Pulse 82   Temp 98.1 F (36.7 C) (Oral)   Resp 17   Ht 5' 3 (1.6 m)   Wt 62 kg   SpO2 100%   BMI 24.23 kg/m   General exam: Appears calm and comfortable. Respiratory system: Clear to auscultation. Respiratory effort normal. Cardiovascular system: S1 & S2 heard, RRR.  Gastrointestinal system: Abdomen is nondistended, soft and minimally tender. Normal bowel sounds heard. Central nervous system: Alert and oriented. No focal neurological deficits. Musculoskeletal: No edema. No calf tenderness Psychiatry: Judgement and insight appear normal. Mood & affect appropriate.  Condition at discharge: stable  The results of significant diagnostics from this hospitalization (including imaging, microbiology, ancillary and laboratory) are listed below for reference.   Imaging Studies: HYBRID OR IMAGING ([facility] ONLY) Result Date: 09/10/2024 There is no interpretation for this exam.  This order is for images obtained during a surgical procedure.  Please See Surgeries Tab for more information regarding the procedure.   CT Angio Abd/Pel W and/or Wo Contrast Result Date: 09/09/2024 EXAM: CTA ABDOMEN AND PELVIS WITH AND WITHOUT CONTRAST 09/09/2024 09:02:02 PM TECHNIQUE: CTA images of the abdomen and pelvis with and without intravenous contrast. Three-dimensional MIP/volume rendered formations were performed.  Automated exposure control, iterative reconstruction, and/or weight based adjustment of the mA/kV was utilized to reduce the radiation dose to as low as reasonably achievable. COMPARISON: Prior examination of 5:49 pm. CLINICAL HISTORY: severe abdominal pain. Mesenteric ischemia. FINDINGS: VASCULATURE: AORTA: Extensive aortoiliac atherosclerotic calcification. No aortic aneurysm or dissection. No hemodynamically significant stenosis. CELIAC TRUNK: There is some 50% stenosis of the celiac axis origin. Normal anatomic configuration. Note is made of an accessory left hepatic artery. No aneurysm or dissection. SUPERIOR MESENTERIC ARTERY: 75% stenosis of the superior mesenteric artery at its origin. Distally widely patent without aneurysmal dissection. RENAL ARTERIES: Single renal arteries bilaterally are widely patent and demonstrate normal vascular morphology. No aneurysm or dissection. ILIAC ARTERIES: Multifocal 50% stenosis of the proximal right common iliac artery. Moderate calcific plaque within the right external iliac artery without hemodynamically significant stenosis. Right internal iliac artery is diseased with patent its origin. Focal 75% stenosis of the left common iliac artery secondary to calcified atherosclerotic plaque. Less than 50% stenosis of the left external iliac artery secondary to calcified atherosclerotic plaque. New occlusion of the left internal iliac artery at its origin secondary to calcified plaque. LIVER: Moderate hepatic steatosis. Mild hepatomegaly. Correlation with liver enzymes may be helpful to exclude an obstructive or inflammatory process. GALLBLADDER AND BILE DUCTS: Status post cholecystectomy. No biliary ductal dilatation. SPLEEN: The spleen is unremarkable. PANCREAS: The pancreas is unremarkable. ADRENAL GLANDS: Bilateral adrenal glands demonstrate no acute abnormality. KIDNEYS, URETERS AND BLADDER: No stones in the kidneys or ureters. No hydronephrosis. No perinephric or  periureteral stranding. Urinary bladder is unremarkable. 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 Distally patent. Moderate sigmoid diverticulosis without superimposed focal inflammatory change. No evidence of regional bowel ischemia. The stomach, small bowel, and large bowel are otherwise unremarkable. Appendix absent. REPRODUCTIVE: Status post hysterectomy. Complex mixed solid and cystic 4.5 cm right ovarian mass again identified. Gynecologic consultation is recommended for further management, as noted previously, given its interval increase in size since remote prior examination of September 23, 2017. PERITONEUM AND RETROPERITONEUM: No ascites or free air. LUNG BASE: No acute abnormality. LYMPH NODES: No lymphadenopathy. BONES AND SOFT TISSUES: No acute abnormality of the bones. No acute soft tissue abnormality. RAF SCORE: Aortic atherosclerosis (ICD10-I70.0) IMPRESSION: 1. 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 2. Hemodynamically significant stenoses of the superior and inferior mesenteric artery origins. 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 No evidence of acute mesenteric ischemia. 3. Peripheral vascular disease with hemodynamically significant lower extremity arterial inflow disease as outlined above. In the setting of gluteal claudication or lower extremity arterovascular insufficiency, vascular consultation is recommended for further management. 4. Moderate hepatic steatosis  and mild hepatomegaly. 5. 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 Gynecologic consultation is recommended for further evaluation.  Electronically signed by: Dorethia Molt MD 09/09/2024 09:19 PM EST RP Workstation: [id number]   CT ABDOMEN PELVIS W CONTRAST Result Date: 09/09/2024 EXAM: CT ABDOMEN AND PELVIS WITH CONTRAST 09/09/2024 06:11:50 PM TECHNIQUE: CT of the abdomen and pelvis was performed with the administration of 100 mL of iohexol  (OMNIPAQUE ) 300 MG/ML solution. Multiplanar reformatted images are provided for review. Automated exposure control, iterative reconstruction, and/or weight-based adjustment of the mA/kV was utilized to reduce the radiation dose to as low as reasonably achievable. COMPARISON: Prior examinations of 12/19/2023 and 09/23/2017. CLINICAL HISTORY: Abdominal pain, acute, nonlocalized. FINDINGS: LOWER CHEST: No acute abnormality. LIVER: Moderate hepatic steatosis and hepatomegaly. GALLBLADDER AND BILE DUCTS: Status post cholecystectomy. Mild dilation of the biliary tree likely reflects post cholecystectomy change. Correlation with liver enzymes may be helpful to exclude an obstructive or inflammatory process. SPLEEN: No acute abnormality. PANCREAS: No acute abnormality. ADRENAL GLANDS: No acute abnormality. KIDNEYS, URETERS AND BLADDER: No stones in the kidneys or ureters. No hydronephrosis. No perinephric or periureteral stranding. Urinary bladder is unremarkable. Particularly prominent atherosclerotic calcifications seen at the origin of the right renal artery. If there is clinical evidence of hemodynamically significant renal artery stenosis, CT arteriography may be more helpful for further evaluation. GI AND BOWEL: Moderate sigmoid diverticulosis. Appendix absent. The stomach, small bowel, and large bowel are otherwise unremarkable. There is no bowel obstruction. PERITONEUM AND RETROPERITONEUM: No ascites. No free air. VASCULATURE: Extensive aortoiliac atherosclerotic calcifications. Particularly prominent atherosclerotic calcifications seen at the origin of the mesenteric and bilateral lower extremity  arterial inflow. Peripheral vascular disease. If there is clinical evidence of chronic mesenteric ischemia or lower extremity claudication, CT arteriography may be more helpful for further evaluation. LYMPH NODES: No lymphadenopathy. REPRODUCTIVE ORGANS: Uterus is absent. Residual left ovaries unremarkable. 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 This mass measures 4.2 x 4.5 x 3.3 cm. Volume calculated as 32.7 cm. Gynecologic consultation is recommended for further evaluation. BONES AND SOFT TISSUES: No acute osseous abnormality. No focal soft tissue abnormality. RAF SCORE: Aortic atherosclerosis (ICD10-I70.0) IMPRESSION: 1. 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 2. Moderate hepatic steatosis and hepatomegaly. 3. Mild dilation of the biliary tree likely reflects post cholecystectomy change, and correlation with liver enzymes may be helpful to exclude an obstructive or inflammatory process. 4. Peripheral vascular disease, and if there is clinical evidence of hemodynamically significant renal artery stenosis, chronic mesenteric ischemia, or lower extremity claudication, CT arteriography may be more helpful for further evaluation. 5. Moderate sigmoid diverticulosis without evidence of diverticulitis. 6. RAF score includes Aortic atherosclerosis (ICD10-I70.0). Electronically signed by: Dorethia Molt MD 09/09/2024 06:26 PM EST RP Workstation: [id number]   DG Chest Portable 1 View Result Date: 09/09/2024 CLINICAL DATA:  Shortness of breath and chest pain. EXAM:  PORTABLE CHEST 1 VIEW COMPARISON:  None Available. FINDINGS: No focal consolidation, pleural effusion, or pneumothorax. Background of emphysema. Mild cardiomegaly. Atherosclerotic calcification of the aorta. No  acute osseous pathology. IMPRESSION: 1. No active disease. 2. Mild cardiomegaly. Electronically Signed   By: Vanetta Chou M.D.   On: 09/09/2024 15:18    Microbiology: Results for orders placed or performed during the hospital encounter of 09/09/24  Urine Culture     Status: Abnormal   Collection Time: 09/09/24  2:18 PM   Specimen: Urine, Clean Catch  Result Value Ref Range Status   Specimen Description   Final    URINE, CLEAN CATCH Performed at [facility], [address] [place], [place] [address]    Special Requests   Final    NONE Performed at [facility], [address] [place], [place] [address]    Culture (A)  Final    <10,000 COLONIES/mL INSIGNIFICANT GROWTH Performed at [facility], [address] [place], [place] [address]    Report Status 09/11/2024 FINAL  Final  Resp panel by RT-PCR (RSV, Flu A&B, Covid) Anterior Nasal Swab     Status: None   Collection Time: 09/09/24  3:43 PM   Specimen: Anterior Nasal Swab  Result Value Ref Range Status   SARS Coronavirus 2 by RT PCR NEGATIVE NEGATIVE Final    Comment: (NOTE) SARS-CoV-2 target nucleic acids are NOT DETECTED.  The SARS-CoV-2 RNA is generally detectable in upper respiratory specimens during the acute phase of infection. The lowest concentration of SARS-CoV-2 viral copies this assay can detect is 138 copies/mL. A negative result does not preclude SARS-Cov-2 infection and should not be used as the sole basis for treatment or other patient management decisions. A negative result may occur with  improper specimen collection/handling, submission of specimen other than nasopharyngeal swab, presence of viral mutation(s) within the areas targeted by this assay, and inadequate  number of viral copies(<138 copies/mL). A negative result must be combined with clinical observations, patient history, and epidemiological information. The expected result is Negative.  Fact Sheet for Patients:  bloggercourse.com  Fact Sheet for Healthcare Providers:  seriousbroker.it  This test is no t yet approved or cleared by the [place]  FDA and  has been authorized for detection and/or diagnosis of SARS-CoV-2 by FDA under an Emergency Use Authorization (EUA). This EUA will remain  in effect (meaning this test can be used) for the duration of the COVID-19 declaration under Section 564(b)(1) of the Act, 21 U.S.C.section 360bbb-3(b)(1), unless the authorization is terminated  or revoked sooner.       Influenza A by PCR NEGATIVE NEGATIVE Final   Influenza B by PCR NEGATIVE NEGATIVE Final    Comment: (NOTE) The Xpert Xpress SARS-CoV-2/FLU/RSV plus assay is intended as an aid in the diagnosis of influenza from Nasopharyngeal swab specimens and should not be used as a sole basis for treatment. Nasal washings and aspirates are unacceptable for Xpert Xpress SARS-CoV-2/FLU/RSV testing.  Fact Sheet for Patients: bloggercourse.com  Fact Sheet for Healthcare Providers: seriousbroker.it  This test is not yet approved or cleared by the [place]  FDA and has been authorized for detection and/or diagnosis of SARS-CoV-2 by FDA under an Emergency Use Authorization (EUA). This EUA will remain in effect (meaning this test can be used) for the duration of the COVID-19 declaration under Section 564(b)(1) of the Act, 21 U.S.C. section 360bbb-3(b)(1), unless the authorization is terminated or revoked.     Resp Syncytial Virus by PCR NEGATIVE NEGATIVE Final    Comment: (NOTE) Fact Sheet for Patients: bloggercourse.com  Fact Sheet for Healthcare  Providers: seriousbroker.it  This test  is not yet approved or cleared by the [place]  FDA and has been authorized for detection and/or diagnosis of SARS-CoV-2 by FDA under an Emergency Use Authorization (EUA). This EUA will remain in effect (meaning this test can be used) for the duration of the COVID-19 declaration under Section 564(b)(1) of the Act, 21 U.S.C. section 360bbb-3(b)(1), unless the authorization is terminated or revoked.  Performed at [facility], [address], [place], [place] [address]   Surgical pcr screen     Status: None   Collection Time: 09/10/24  1:01 PM   Specimen: Nasal Mucosa; Nasal Swab  Result Value Ref Range Status   MRSA, PCR NEGATIVE NEGATIVE Final   Staphylococcus aureus NEGATIVE NEGATIVE Final    Comment: (NOTE) The Xpert SA Assay (FDA approved for NASAL specimens in patients 24 years of age and older), is one component of a comprehensive surveillance program. It is not intended to diagnose infection nor to guide or monitor treatment. Performed at [facility], [address], [place], [place] [address]     Labs: CBC: Recent Labs  Lab 09/09/24 1628 09/11/24 0415 09/12/24 0348  WBC 8.2 8.0 9.8  HGB 13.3 10.7* 11.6*  HCT 38.8 33.0* 34.5*  MCV 93.7 99.4 98.9  PLT 373 349 324   Basic Metabolic Panel: Recent Labs  Lab 09/09/24 1543 09/11/24 0415 09/12/24 0348 09/13/24 0316 09/14/24 0622  NA 132* 131* 134* 136 136  K 4.3 4.1 3.7 4.4 3.5  CL 92* 96* 96* 98 96*  CO2 25 22 27 26 27   GLUCOSE 95 139* 143* 134* 140*  BUN 8 9 14 8 11   CREATININE 0.73 0.80 0.82 0.76 0.79  CALCIUM  10.0 8.6* 9.1 9.0 9.5   Liver Function Tests: Recent Labs  Lab 09/09/24 1543 09/11/24 0415 09/12/24 0348 09/13/24 0316 09/14/24 0622  AST 48* 48* 181* 681* 151*  ALT 54* 50* 147* 449* 274*  ALKPHOS 149* 112 140* 224* 207*  BILITOT 0.4 0.3 0.4 0.6 0.5  PROT 8.1 6.6 6.8 6.8 7.8  ALBUMIN  4.9 4.3 4.2 4.1  4.6   CBG: Recent Labs  Lab 09/13/24 0617 09/13/24 1257 09/13/24 1653 09/13/24 2342 09/14/24 0645  GLUCAP 160* 145* 172* 148* 123*    Discharge time spent: 35 minutes.  Signed: Elgin Lam, MD [facility] 09/14/2024 "

## 2024-09-14 NOTE — Progress Notes (Signed)
 [facility] Progress Note  4 Days Post-Op  Subjective: CC:  Pain controlled. Reports flatus, some belching. Ate multiple meals yesterday, include a burger for dinner, without N/V. No BM yet.   Objective: Vital signs in last 24 hours: Temp:  [97.9 F (36.6 C)-98.3 F (36.8 C)] 97.9 F (36.6 C) (12/22 0820) Pulse Rate:  [81-99] 99 (12/22 0820) Resp:  [17-20] 20 (12/22 0820) BP: (93-162)/(57-92) 103/57 (12/22 0820) SpO2:  [98 %-100 %] 98 % (12/22 0820) Last BM Date : 09/14/24  Intake/Output from previous day: 12/21 0701 - 12/22 0700 In: 480 [P.O.:480] Out: -  Intake/Output this shift: No intake/output data recorded.  PE: Gen:  Alert, NAD, pleasant Card:  Regular rate and rhythm Pulm:  Normal effort Abd: Soft, protuberant, mild to moderate distention- improved compared to prior, overall non-tender, laparoscopic port sites c/d/I. R groin site also looks clean with mild ecchymosis Skin: warm and dry, no rashes  Psych: A&Ox3   Lab Results:  Recent Labs    09/12/24 0348  WBC 9.8  HGB 11.6*  HCT 34.5*  PLT 324   BMET Recent Labs    09/13/24 0316 09/14/24 0622  NA 136 136  K 4.4 3.5  CL 98 96*  CO2 26 27  GLUCOSE 134* 140*  BUN 8 11  CREATININE 0.76 0.79  CALCIUM  9.0 9.5   PT/INR No results for input(s): LABPROT, INR in the last 72 hours. CMP     Component Value Date/Time   NA 136 09/14/2024 0622   K 3.5 09/14/2024 0622   CL 96 (L) 09/14/2024 0622   CO2 27 09/14/2024 0622   GLUCOSE 140 (H) 09/14/2024 0622   BUN 11 09/14/2024 0622   CREATININE 0.79 09/14/2024 0622   CALCIUM  9.5 09/14/2024 0622   PROT 7.8 09/14/2024 0622   ALBUMIN  4.6 09/14/2024 0622   AST 151 (H) 09/14/2024 0622   ALT 274 (H) 09/14/2024 0622   ALKPHOS 207 (H) 09/14/2024 0622   BILITOT 0.5 09/14/2024 0622   GFRNONAA >60 09/14/2024 0622   GFRAA >60 09/23/2017 1450   Lipase     Component Value Date/Time   LIPASE 32 09/09/2024 1543    Studies/Results: No results  found.   Anti-infectives: Anti-infectives (From admission, onward)    Start     Dose/Rate Route Frequency Ordered Stop   09/10/24 1415  cefoTEtan  (CEFOTAN ) 2 g in sodium chloride  0.9 % 100 mL IVPB        2 g 200 mL/hr over 30 Minutes Intravenous On call to O.R. 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 Magda - on DAPT, statin therapy S/p diagnostic laparoscopy 12/18 Dr. Teresa POD#4 Afebrile, hemodynamically stable Having flatus and now moving her bowels -tolerating a solid diet -surgically stable for DC home. -follow up being arranged -we will sign off at this time.   LOS: 4 days    Burnard FORBES Banter, [facility] Please see Amion for pager number during day hours 7:00am-4:30pm

## 2024-09-14 NOTE — TOC Transition Note (Signed)
 Transition of Care [facility]) - Discharge Note   Patient Details  Name: Sheryl Stone MRN: [id number] Date of Birth: Nov 21, 1955  Transition of [facility]) CM/SW Contact:  Tom-Johnson, Harvest Muskrat, RN Phone Number: 09/14/2024, 11:12 AM   Clinical Narrative:     Patient is scheduled for discharge today.  Readmission Risk Assessment done. Outpatient f/u, hospital f/u and discharge instructions on AVS. Prescriptions sent to [facility] pharmacy and patient will receive meds prior discharge. No ICM needs or recommendations noted. Husband, Sheryl Stone at bedside and will transport at discharge.  No further ICM needs noted.      Final next level of care: Home/Self Care Barriers to Discharge: Barriers Resolved   Patient Goals and CMS Choice Patient states their goals for this hospitalization and ongoing recovery are:: To return home CMS Medicare.gov Compare Post Acute Care list provided to:: Patient Choice offered to / list presented to : NA      Discharge Placement                Patient to be transferred to facility by: Husband Name of family member notified: Sheryl Stone    Discharge Plan and Services Additional resources added to the After Visit Summary for                  DME Arranged: N/A DME Agency: NA       HH Arranged: NA HH Agency: NA        Social Drivers of Health (SDOH) Interventions SDOH Screenings   Food Insecurity: No Food Insecurity (09/12/2024)  Housing: Low Risk (09/12/2024)  Transportation Needs: No Transportation Needs (09/12/2024)  Utilities: Not At Risk (09/12/2024)  Depression (PHQ2-9): Low Risk (07/06/2024)  Social Connections: Socially Integrated (09/13/2024)  Tobacco Use: Medium Risk (09/10/2024)     Readmission Risk Interventions    09/14/2024   11:11 AM  Readmission Risk Prevention Plan  Post Dischage Appt Complete  Medication Screening Complete  Transportation Screening Complete

## 2024-09-14 NOTE — Care Management Important Message (Signed)
 Important Message  Patient Details  Name: Sheryl Stone MRN: [id number] Date of Birth: Jun 30, 1956   Important Message Given:  Yes - Medicare IM     Vonzell Arrie Sharps 09/14/2024, 10:18 AM

## 2024-09-14 NOTE — Progress Notes (Signed)
" ° °   Inpatient Progress Note     Patient Profile/Chief Complaint  68 year old female with history of mesenteric ischemia status post SMA stenting seen in consultation yesterday by Dr. Rollin for elevated liver enzymes and clinical picture consistent with ischemic hepatopathy   Interval History   - No acute events overnight - Denies abdominal pain, nausea, vomiting - LFTs downtrending today    Objective   Vital signs in last 24 hours: Temp:  [97.9 F (36.6 C)-98.3 F (36.8 C)] 97.9 F (36.6 C) (12/22 0820) Pulse Rate:  [81-99] 99 (12/22 0820) Resp:  [17-20] 20 (12/22 0820) BP: (93-162)/(57-92) 103/57 (12/22 0820) SpO2:  [98 %-100 %] 98 % (12/22 0820) Last BM Date : 09/14/24 General:    Alert, resting in bed in no distress Heart:  Regular rate and rhythm; no murmurs Lungs: Respirations even and unlabored, lungs CTA bilaterally Abdomen:  Soft, nontender and nondistended. Normal bowel sounds. Extremities:  Without edema. Neurologic:  Alert and oriented,  grossly normal neurologically. Psych:  Cooperative. Normal mood and affect.  Intake/Output from previous day: 12/21 0701 - 12/22 0700 In: 480 [P.O.:480] Out: -  Intake/Output this shift: No intake/output data recorded.  Lab Results: Recent Labs    09/12/24 0348  WBC 9.8  HGB 11.6*  HCT 34.5*  PLT 324   BMET Recent Labs    09/12/24 0348 09/13/24 0316 09/14/24 0622  NA 134* 136 136  K 3.7 4.4 3.5  CL 96* 98 96*  CO2 27 26 27   GLUCOSE 143* 134* 140*  BUN 14 8 11   CREATININE 0.82 0.76 0.79  CALCIUM  9.1 9.0 9.5   LFT Recent Labs    09/14/24 0622  PROT 7.8  ALBUMIN  4.6  AST 151*  ALT 274*  ALKPHOS 207*  BILITOT 0.5   PT/INR No results for input(s): LABPROT, INR in the last 72 hours.    Clinical Impression   68 year old female with a past medical history noteworthy for DM, HTN, atherosclerosis admitted with acute abdominal pain.  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  Postprocedure patient was noted to have marked elevation of her liver enzymes with AST 681, ALT 449, alkaline phosphatase 224.  Previously had normal liver enzymes.  Abrupt elevation in the setting of procedure and transient hypotension consistent with ischemic hepatopathy.  LFTs are downtrending today.  Labs in March 2025 confirmed negative serologies for HAV, HBV, HCV.   Plan  No further hepatology investigation warranted given decreasing trend in LFTs Avoid hypotension and hepatotoxic drugs Patient does not necessarily need GI follow-up.  Would recommend that repeat hepatic function panel be performed by PCP in the future after acute episode has resolved.  In the setting of ischemic hepatopathy anticipate that LFTs will normalize.  If LFTs remain elevated in the future for some reason patient can be referred to our office.  GI team will sign off at this time.     LOS: 4 days   Sheryl Stone  09/14/2024, 10:39 AM  Sheryl Hausen, MD [facility]  "

## 2024-09-14 NOTE — Discharge Instructions (Addendum)
 Sheryl Stone,  You were in the hospital with abdominal pain which was found to be secondary to a blockage of the arteries to your intestines. You had a procedure with a stent placed and have improved significantly. Please follow-up with surgery as an outpatient, in addition to your PCP.  [facility]  Please arrive at least 30 min before your appointment to complete your check in paperwork.  If you are unable to arrive 30 min prior to your appointment time we may have to cancel or reschedule you. LAPAROSCOPIC SURGERY: POST OP INSTRUCTIONS Always review your discharge instruction sheet given to you by the facility where your surgery was performed. IF YOU HAVE DISABILITY OR FAMILY LEAVE FORMS, YOU MUST BRING THEM TO THE OFFICE FOR PROCESSING.   DO NOT GIVE THEM TO YOUR DOCTOR.  PAIN CONTROL  First take acetaminophen  (Tylenol ) AND/or ibuprofen (Advil) to control your pain after surgery.  Follow directions on package.  Taking acetaminophen  (Tylenol ) and/or ibuprofen (Advil) regularly after surgery will help to control your pain and lower the amount of prescription pain medication you may need.  You should not take more than 4,000 mg (4 grams) of acetaminophen  (Tylenol ) in 24 hours.  You should not take ibuprofen (Advil), aleve, motrin, naprosyn or other NSAIDS if you have a history of stomach ulcers or chronic kidney disease.  A prescription for pain medication may be given to you upon discharge.  Take your pain medication as prescribed, if you still have uncontrolled pain after taking acetaminophen  (Tylenol ) or ibuprofen (Advil). Use ice packs to help control pain. If you need a refill on your pain medication, please contact your pharmacy.  They will contact our office to request authorization. Prescriptions will not be filled after 5pm or on week-ends.  HOME MEDICATIONS Take your usually prescribed medications unless otherwise directed.  DIET You should follow a light  diet the first few days after arrival home.  Be sure to include lots of fluids daily. Avoid fatty, fried foods.   CONSTIPATION It is common to experience some constipation after surgery and if you are taking pain medication.  Increasing fluid intake and taking a stool softener (such as Colace) will usually help or prevent this problem from occurring.  A mild laxative (Milk of Magnesia or Miralax ) should be taken according to package instructions if there are no bowel movements after 48 hours.  WOUND/INCISION CARE Most patients will experience some swelling and bruising in the area of the incisions.  Ice packs will help.  Swelling and bruising can take several days to resolve.  Unless discharge instructions indicate otherwise, follow guidelines below  STERI-STRIPS - you may remove your outer bandages 48 hours after surgery, and you may shower at that time.  You have steri-strips (small skin tapes) in place directly over the incision.  These strips should be left on the skin for 7-10 days.   DERMABOND/SKIN GLUE - you may shower in 24 hours.  The glue will flake off over the next 2-3 weeks. Any sutures or staples will be removed at the office during your follow-up visit.  ACTIVITIES You may resume regular (light) daily activities beginning the next day--such as daily self-care, walking, climbing stairs--gradually increasing activities as tolerated.  You may have sexual intercourse when it is comfortable.  Refrain from any heavy lifting or straining until approved by your doctor. You may drive when you are no longer taking prescription pain medication, you can comfortably wear a seatbelt, and you can safely  maneuver your car and apply brakes.  FOLLOW-UP You should see your doctor in the office for a follow-up appointment approximately 2-3 weeks after your surgery.  You should have been given your post-op/follow-up appointment when your surgery was scheduled.  If you did not receive a post-op/follow-up  appointment, make sure that you call for this appointment within a day or two after you arrive home to insure a convenient appointment time.   WHEN TO CALL YOUR DOCTOR: Fever over 101.0 Inability to urinate Continued bleeding from incision. Increased pain, redness, or drainage from the incision. Increasing abdominal pain  The clinic staff is available to answer your questions during regular business hours.  Please dont hesitate to call and ask to speak to one of the nurses for clinical concerns.  If you have a medical emergency, go to the nearest emergency room or call 911.  A surgeon from [facility] is always on call at the hospital. [address], [address], [place], [place]  [address] ? [address] [address], [place], [place]   [address] [phone] ? [phone] ? FAX [phone]

## 2024-09-20 NOTE — Progress Notes (Signed)
 "  Acute Office Visit  Subjective:  Patient ID: Sheryl Stone, female    DOB: 1956-06-30  Age: 68 y.o. MRN: [id number]  CC: No chief complaint on file.     HPI Sheryl Stone is here for a hospital follow up. On 09/09/2024, she was seen in the emergency department for severe abdominal pain, noting a history of this occurring intermittently along with chest pain. Labs with mild hyponatremia, mild transaminates, a stable CBC, negative Covid-19/Influenza, troponin <15, and elevated lactic acid at 3.3. CXR without active disease. 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  Moderate hepatic steatosis, hepatomegaly biliary duct dilatation postcholecystectomy PVD moderate sigmoid diverticulosis. 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 No evidence of acute mesenteric ischemia. Peripheral vascular disease with hemodynamically significant lower extremity arterial inflow disease as outlined above. In the setting of gluteal claudication or lower extremity arterovascular insufficiency. She was give IV fluid hydration and a heparin  drip while vascular was consulted, and while emergent surgical intervention was not suggested, she was admitted to the hospital. On 09/10/2024, she underwent an aortogram, superior mesenteric artery stent great, and a diagnostic laparoscopy with adhesion lysis.       Past Medical History:  Diagnosis Date   Anxiety and depression    Back pain    Diabetes mellitus without complication (HCC)    Fibromyalgia    Hypertension    Panic attacks     Past Surgical History:  Procedure Laterality Date    ABDOMINAL AORTIC ENDOVASCULAR STENT GRAFT N/A 09/10/2024   Procedure: INSERTION, ENDOVASCULAR STENT GRAFT, SUPERIOR MESENTERIC ARTERY;  Surgeon: Magda Debby SAILOR, MD;  Location: [facility] OR;  Service: Vascular;  Laterality: N/A;  SMA INTERVENTION   AORTOGRAM N/A 09/10/2024   Procedure: AORTOGRAM;  Surgeon: Magda Debby SAILOR, MD;  Location: [facility] OR;  Service: Vascular;  Laterality: N/A;   LAPAROSCOPY  09/10/2024   Procedure: LAPAROSCOPY, DIAGNOSTIC WITH LYSIS OF ADHESIONS;  Surgeon: Teresa Lonni HERO, MD;  Location: [facility] OR;  Service: General;;   PARTIAL HYSTERECTOMY     ULTRASOUND GUIDANCE FOR VASCULAR ACCESS  09/10/2024   Procedure: ULTRASOUND GUIDANCE, FOR VASCULAR ACCESS;  Surgeon: Magda Debby SAILOR, MD;  Location: [facility] OR;  Service: Vascular;;    No family history on file.  Social History   Socioeconomic History   Marital status: Married    Spouse name: Not on file   Number of children: Not on file   Years of education: Not on file   Highest education level: Not on file  Occupational History   Not on file  Tobacco Use   Smoking status: Former   Smokeless tobacco: Never  Substance and Sexual Activity   Alcohol use: No   Drug use: No   Sexual activity: Not on file  Other Topics Concern   Not on file  Social History Narrative   Not on file   Social Drivers of Health   Tobacco Use: Medium Risk (09/10/2024)   Patient History    Smoking Tobacco Use: Former    Smokeless Tobacco Use: Never    Passive Exposure: Not on Actuary Strain: Not on file  Food Insecurity: No Food Insecurity (09/12/2024)   Epic  Worried About Programme Researcher, Broadcasting/film/video in the Last Year: Never true    Ran Out of Food in the Last Year: Never true  Transportation Needs: No Transportation Needs (09/12/2024)   Epic    Lack of Transportation (Medical): No    Lack of Transportation (Non-Medical): No  Physical Activity: Not on file  Stress: Not on file  Social Connections: Socially Integrated  (09/13/2024)   Social Connection and Isolation Panel    Frequency of Communication with Friends and Family: More than three times a week    Frequency of Social Gatherings with Friends and Family: More than three times a week    Attends Religious Services: More than 4 times per year    Active Member of [employer] or Organizations: Yes    Attends Banker Meetings: More than 4 times per year    Marital Status: Married  Catering Manager Violence: Not At Risk (09/12/2024)   Epic    Fear of Current or Ex-Partner: No    Emotionally Abused: No    Physically Abused: No    Sexually Abused: No  Depression (PHQ2-9): Low Risk (07/06/2024)   Depression (PHQ2-9)    PHQ-2 Score: 1  Alcohol Screen: Not on file  Housing: Low Risk (09/12/2024)   Epic    Unable to Pay for Housing in the Last Year: No    Number of Times Moved in the Last Year: 0    Homeless in the Last Year: No  Utilities: Not At Risk (09/12/2024)   Epic    Threatened with loss of utilities: No  Health Literacy: Not on file    ROS All ROS negative except what is listed in the HPI.   Objective:   Today's Vitals: There were no vitals taken for this visit.  Physical Exam  Assessment & Plan:   Problem List Items Addressed This Visit   None     Follow-up: No follow-ups on file.   Sheryl FURY Almarie, DNP, FNP-C  I,Emily Lagle,acting as a neurosurgeon for Sheryl KATHEE Almarie, NP.,have documented all relevant documentation on the behalf of Sheryl KATHEE Almarie, NP.   I, Sheryl KATHEE Almarie, NP, have reviewed all documentation for this visit. The documentation on 09/21/2024 for the exam, diagnosis, procedures, and orders are all accurate and complete. "

## 2024-09-21 ENCOUNTER — Ambulatory Visit: Payer: Self-pay | Admitting: Family Medicine

## 2024-09-21 ENCOUNTER — Ambulatory Visit: Admitting: Family Medicine

## 2024-09-21 ENCOUNTER — Encounter: Payer: Self-pay | Admitting: Family Medicine

## 2024-09-21 VITALS — BP 127/89 | HR 81 | Temp 98.2°F | Ht 63.0 in | Wt 155.8 lb

## 2024-09-21 DIAGNOSIS — R748 Abnormal levels of other serum enzymes: Secondary | ICD-10-CM

## 2024-09-21 DIAGNOSIS — Z09 Encounter for follow-up examination after completed treatment for conditions other than malignant neoplasm: Secondary | ICD-10-CM

## 2024-09-21 DIAGNOSIS — E871 Hypo-osmolality and hyponatremia: Secondary | ICD-10-CM

## 2024-09-21 DIAGNOSIS — K551 Chronic vascular disorders of intestine: Secondary | ICD-10-CM | POA: Diagnosis not present

## 2024-09-21 DIAGNOSIS — I10 Essential (primary) hypertension: Secondary | ICD-10-CM | POA: Diagnosis not present

## 2024-09-21 DIAGNOSIS — J029 Acute pharyngitis, unspecified: Secondary | ICD-10-CM

## 2024-09-21 DIAGNOSIS — R945 Abnormal results of liver function studies: Secondary | ICD-10-CM

## 2024-09-21 DIAGNOSIS — R7989 Other specified abnormal findings of blood chemistry: Secondary | ICD-10-CM

## 2024-09-21 DIAGNOSIS — N838 Other noninflammatory disorders of ovary, fallopian tube and broad ligament: Secondary | ICD-10-CM | POA: Insufficient documentation

## 2024-09-21 LAB — COMPREHENSIVE METABOLIC PANEL WITH GFR
ALT: 37 U/L — ABNORMAL HIGH (ref 3–35)
AST: 19 U/L (ref 5–37)
Albumin: 4.6 g/dL (ref 3.5–5.2)
Alkaline Phosphatase: 118 U/L — ABNORMAL HIGH (ref 39–117)
BUN: 10 mg/dL (ref 6–23)
CO2: 27 meq/L (ref 19–32)
Calcium: 9.6 mg/dL (ref 8.4–10.5)
Chloride: 93 meq/L — ABNORMAL LOW (ref 96–112)
Creatinine, Ser: 0.71 mg/dL (ref 0.40–1.20)
GFR: 85.96 mL/min
Glucose, Bld: 104 mg/dL — ABNORMAL HIGH (ref 70–99)
Potassium: 4.5 meq/L (ref 3.5–5.1)
Sodium: 132 meq/L — ABNORMAL LOW (ref 135–145)
Total Bilirubin: 0.4 mg/dL (ref 0.2–1.2)
Total Protein: 7.4 g/dL (ref 6.0–8.3)

## 2024-09-21 LAB — CBC WITH DIFFERENTIAL/PLATELET
Basophils Absolute: 0.1 K/uL (ref 0.0–0.1)
Basophils Relative: 0.9 % (ref 0.0–3.0)
Eosinophils Absolute: 0.2 K/uL (ref 0.0–0.7)
Eosinophils Relative: 3.4 % (ref 0.0–5.0)
HCT: 34.7 % — ABNORMAL LOW (ref 36.0–46.0)
Hemoglobin: 12 g/dL (ref 12.0–15.0)
Lymphocytes Relative: 34.4 % (ref 12.0–46.0)
Lymphs Abs: 2.4 K/uL (ref 0.7–4.0)
MCHC: 34.6 g/dL (ref 30.0–36.0)
MCV: 94.5 fl (ref 78.0–100.0)
Monocytes Absolute: 0.7 K/uL (ref 0.1–1.0)
Monocytes Relative: 10.3 % (ref 3.0–12.0)
Neutro Abs: 3.6 K/uL (ref 1.4–7.7)
Neutrophils Relative %: 51 % (ref 43.0–77.0)
Platelets: 538 K/uL — ABNORMAL HIGH (ref 150.0–400.0)
RBC: 3.68 Mil/uL — ABNORMAL LOW (ref 3.87–5.11)
RDW: 13.8 % (ref 11.5–15.5)
WBC: 7 K/uL (ref 4.0–10.5)

## 2024-09-21 NOTE — Assessment & Plan Note (Signed)
 Ovarian mass noted on CT scan, previously seen several years ago, but area is now larger. On review of surgical note, they were unable to visualize this area. Will go ahead with GYN referral to further evaluate as indicated.

## 2024-09-21 NOTE — Assessment & Plan Note (Signed)
 Significant blood pressure difference between arms, higher in left arm. No aortic dissection or aneurysm on imaging. Asymptomatic. Chronic per patient. Recommend she discuss will cardiologist at upcoming appointment and monitor for any symptoms.

## 2024-10-05 ENCOUNTER — Other Ambulatory Visit

## 2024-10-05 ENCOUNTER — Other Ambulatory Visit: Payer: Self-pay | Admitting: Family Medicine

## 2024-10-05 DIAGNOSIS — R7989 Other specified abnormal findings of blood chemistry: Secondary | ICD-10-CM | POA: Diagnosis not present

## 2024-10-05 DIAGNOSIS — R748 Abnormal levels of other serum enzymes: Secondary | ICD-10-CM

## 2024-10-05 DIAGNOSIS — E871 Hypo-osmolality and hyponatremia: Secondary | ICD-10-CM | POA: Diagnosis not present

## 2024-10-05 LAB — CBC WITH DIFFERENTIAL/PLATELET
Basophils Absolute: 0 K/uL (ref 0.0–0.1)
Basophils Relative: 0.5 % (ref 0.0–3.0)
Eosinophils Absolute: 0.2 K/uL (ref 0.0–0.7)
Eosinophils Relative: 2 % (ref 0.0–5.0)
HCT: 37.1 % (ref 36.0–46.0)
Hemoglobin: 12.5 g/dL (ref 12.0–15.0)
Lymphocytes Relative: 31 % (ref 12.0–46.0)
Lymphs Abs: 2.3 K/uL (ref 0.7–4.0)
MCHC: 33.7 g/dL (ref 30.0–36.0)
MCV: 93.5 fl (ref 78.0–100.0)
Monocytes Absolute: 0.5 K/uL (ref 0.1–1.0)
Monocytes Relative: 6.4 % (ref 3.0–12.0)
Neutro Abs: 4.5 K/uL (ref 1.4–7.7)
Neutrophils Relative %: 60.1 % (ref 43.0–77.0)
Platelets: 474 K/uL — ABNORMAL HIGH (ref 150.0–400.0)
RBC: 3.96 Mil/uL (ref 3.87–5.11)
RDW: 13.8 % (ref 11.5–15.5)
WBC: 7.5 K/uL (ref 4.0–10.5)

## 2024-10-05 LAB — COMPREHENSIVE METABOLIC PANEL WITH GFR
ALT: 23 U/L (ref 3–35)
AST: 19 U/L (ref 5–37)
Albumin: 4.4 g/dL (ref 3.5–5.2)
Alkaline Phosphatase: 110 U/L (ref 39–117)
BUN: 10 mg/dL (ref 6–23)
CO2: 28 meq/L (ref 19–32)
Calcium: 9.5 mg/dL (ref 8.4–10.5)
Chloride: 87 meq/L — ABNORMAL LOW (ref 96–112)
Creatinine, Ser: 0.7 mg/dL (ref 0.40–1.20)
GFR: 87.41 mL/min
Glucose, Bld: 91 mg/dL (ref 70–99)
Potassium: 4.5 meq/L (ref 3.5–5.1)
Sodium: 127 meq/L — ABNORMAL LOW (ref 135–145)
Total Bilirubin: 0.4 mg/dL (ref 0.2–1.2)
Total Protein: 7.5 g/dL (ref 6.0–8.3)

## 2024-10-05 MED ORDER — CLOPIDOGREL BISULFATE 75 MG PO TABS: 75.0000 mg | ORAL_TABLET | Freq: Every day | ORAL | 0 refills | Status: AC

## 2024-10-05 NOTE — Telephone Encounter (Signed)
 Copied from CRM #[id number]. Topic: Clinical - Medication Refill >> Oct 05, 2024 12:20 PM Alfonso ORN wrote: Medication: clopidogrel  (PLAVIX ) 75 MG tablet  Has the patient contacted their pharmacy? Yes (Agent: If no, request that the patient contact the pharmacy for the refill. If patient does not wish to contact the pharmacy document the reason why and proceed with request.) (Agent: If yes, when and what did the pharmacy advise?)  This is the patient's preferred pharmacy:  [facility] [address] [place] - [address] [place] [address] Phone: [phone] Fax: [phone]  Is this the correct pharmacy for this prescription? Yes If no, delete pharmacy and type the correct one.   Has the prescription been filled recently? No first time getting a refill was given when at the last ER visit   Is the patient out of the medication? No  Has the patient been seen for an appointment in the last year OR does the patient have an upcoming appointment? Yes  Can we respond through MyChart? Yes  Agent: Please be advised that Rx refills may take up to 3 business days. We ask that you follow-up with your pharmacy.

## 2024-10-06 ENCOUNTER — Ambulatory Visit: Payer: Self-pay | Admitting: Family Medicine

## 2024-10-06 DIAGNOSIS — F419 Anxiety disorder, unspecified: Secondary | ICD-10-CM

## 2024-10-06 DIAGNOSIS — E871 Hypo-osmolality and hyponatremia: Secondary | ICD-10-CM

## 2024-10-07 ENCOUNTER — Other Ambulatory Visit (INDEPENDENT_AMBULATORY_CARE_PROVIDER_SITE_OTHER)

## 2024-10-07 DIAGNOSIS — E871 Hypo-osmolality and hyponatremia: Secondary | ICD-10-CM | POA: Diagnosis not present

## 2024-10-08 ENCOUNTER — Other Ambulatory Visit: Payer: Self-pay

## 2024-10-08 DIAGNOSIS — K551 Chronic vascular disorders of intestine: Secondary | ICD-10-CM

## 2024-10-08 LAB — BASIC METABOLIC PANEL WITH GFR
BUN: 10 mg/dL (ref 6–23)
CO2: 29 meq/L (ref 19–32)
Calcium: 9.2 mg/dL (ref 8.4–10.5)
Chloride: 88 meq/L — ABNORMAL LOW (ref 96–112)
Creatinine, Ser: 0.71 mg/dL (ref 0.40–1.20)
GFR: 85.93 mL/min
Glucose, Bld: 100 mg/dL — ABNORMAL HIGH (ref 70–99)
Potassium: 4.6 meq/L (ref 3.5–5.1)
Sodium: 125 meq/L — ABNORMAL LOW (ref 135–145)

## 2024-10-09 ENCOUNTER — Telehealth: Payer: Self-pay | Admitting: Family Medicine

## 2024-10-09 ENCOUNTER — Encounter (HOSPITAL_BASED_OUTPATIENT_CLINIC_OR_DEPARTMENT_OTHER): Payer: Self-pay | Admitting: Emergency Medicine

## 2024-10-09 ENCOUNTER — Ambulatory Visit: Payer: Self-pay | Admitting: Family Medicine

## 2024-10-09 ENCOUNTER — Other Ambulatory Visit: Payer: Self-pay

## 2024-10-09 ENCOUNTER — Emergency Department (HOSPITAL_BASED_OUTPATIENT_CLINIC_OR_DEPARTMENT_OTHER): Admission: EM | Admit: 2024-10-09 | Discharge: 2024-10-09 | Disposition: A | Discharge status: Home / Self Care

## 2024-10-09 DIAGNOSIS — I10 Essential (primary) hypertension: Secondary | ICD-10-CM | POA: Diagnosis not present

## 2024-10-09 DIAGNOSIS — Z7982 Long term (current) use of aspirin: Secondary | ICD-10-CM | POA: Insufficient documentation

## 2024-10-09 DIAGNOSIS — E119 Type 2 diabetes mellitus without complications: Secondary | ICD-10-CM | POA: Diagnosis not present

## 2024-10-09 DIAGNOSIS — Z91040 Latex allergy status: Secondary | ICD-10-CM | POA: Diagnosis not present

## 2024-10-09 DIAGNOSIS — E871 Hypo-osmolality and hyponatremia: Secondary | ICD-10-CM | POA: Insufficient documentation

## 2024-10-09 DIAGNOSIS — I2510 Atherosclerotic heart disease of native coronary artery without angina pectoris: Secondary | ICD-10-CM | POA: Diagnosis not present

## 2024-10-09 DIAGNOSIS — Z79899 Other long term (current) drug therapy: Secondary | ICD-10-CM | POA: Insufficient documentation

## 2024-10-09 DIAGNOSIS — Z7984 Long term (current) use of oral hypoglycemic drugs: Secondary | ICD-10-CM | POA: Insufficient documentation

## 2024-10-09 LAB — CBC
HCT: 36.7 % (ref 36.0–46.0)
Hemoglobin: 12.6 g/dL (ref 12.0–15.0)
MCH: 32.1 pg (ref 26.0–34.0)
MCHC: 34.3 g/dL (ref 30.0–36.0)
MCV: 93.6 fL (ref 80.0–100.0)
Platelets: 404 K/uL — ABNORMAL HIGH (ref 150–400)
RBC: 3.92 MIL/uL (ref 3.87–5.11)
RDW: 13.4 % (ref 11.5–15.5)
WBC: 8.1 K/uL (ref 4.0–10.5)
nRBC: 0 % (ref 0.0–0.2)

## 2024-10-09 LAB — BASIC METABOLIC PANEL WITH GFR
Anion gap: 11 (ref 5–15)
BUN: 12 mg/dL (ref 8–23)
CO2: 26 mmol/L (ref 22–32)
Calcium: 9.2 mg/dL (ref 8.9–10.3)
Chloride: 91 mmol/L — ABNORMAL LOW (ref 98–111)
Creatinine, Ser: 0.76 mg/dL (ref 0.44–1.00)
GFR, Estimated: >60 mL/min
Glucose, Bld: 97 mg/dL (ref 70–99)
Potassium: 4 mmol/L (ref 3.5–5.1)
Sodium: 127 mmol/L — ABNORMAL LOW (ref 135–145)

## 2024-10-09 LAB — SODIUM, URINE, RANDOM: Sodium, Ur: 52 mmol/L (ref 28–272)

## 2024-10-09 LAB — OSMOLALITY, URINE: Osmolality, Ur: 213 mosm/kg (ref 50–1200)

## 2024-10-09 NOTE — Telephone Encounter (Signed)
 Spoke with patient, she is going to go to ED for evaluation/treatment for low sodium. [place] - FYI.

## 2024-10-09 NOTE — Telephone Encounter (Signed)
 Copied from CRM [id number]. Topic: General - Other >> Oct 09, 2024 12:43 PM Sheryl Stone wrote: Reason for CRM: Patient called in regarding needing to speak back with Sheryl Stone has some additional questions would like a callback

## 2024-10-09 NOTE — ED Triage Notes (Signed)
 Pt reports being notified by primary provider of abnormal lab result, Sodium 125.  Denies weakness, excessive urination.

## 2024-10-09 NOTE — Discharge Instructions (Addendum)
 Evaluation today revealed that you do have low sodium on your blood work but it is improved in comparison to last check.  Please continue electrolyte rehydration at home and follow-up with your PCP for recheck of your labs and reevaluation.  If you become dizzy, lightheaded, nauseous or have any lower extremity swelling or any other concerning symptom please return to ED for further evaluation.

## 2024-10-09 NOTE — Telephone Encounter (Signed)
 Copied from CRM [id number]. Topic: General - Other >> Oct 09, 2024 12:43 PM Thersia BROCKS wrote: Reason for CRM: Patient called in regarding needing to speak back with [place] has some additional questions would like a callback

## 2024-10-09 NOTE — ED Provider Notes (Signed)
 " [place] [facility] Provider Note   CSN: [id number] Arrival date & time: 10/09/24  1515     Patient presents with: Abnormal Lab  HPI Sheryl Stone is a 69 y.o. female with hypertension, diabetes, CAD presenting for abnormal lab.  Was called by her primary care provider after routine labs were taken a couple days ago.  Called today and was informed that sodium is 125.  At this time she denies any weakness, excessive urination, dizziness.  Denies shortness of breath or lower extremity swelling.  She states she drank a lot of Gatorade and Pedialyte just prior to arrival. She states she feels fine.  Denied nausea, abdominal pain or vomiting.    Abnormal Lab      Prior to Admission medications  Medication Sig Start Date End Date Taking? Authorizing Provider  aspirin  81 MG tablet Take 81 mg by mouth daily.    [provider]  [Paused] atorvastatin  (LIPITOR) 80 MG tablet Take 80 mg by mouth daily. Wait to take this until your doctor or other care provider tells you to start again.    [provider]  busPIRone  (BUSPAR ) 5 MG tablet TAKE 1 & 1/2 (ONE & ONE-HALF) TABLETS BY MOUTH TWICE DAILY Patient taking differently: Take 5 mg by mouth 3 (three) times daily. 08/19/24   Almarie Waddell NOVAK, NP  Cholecalciferol ([employer] VIT D 5000 HIGH-POTENCY PO) Take 5,000 Units by mouth daily.    [provider]  clopidogrel  (PLAVIX ) 75 MG tablet Take 1 tablet (75 mg total) by mouth daily with breakfast. 10/05/24   Almarie Waddell NOVAK, NP  dicyclomine  (BENTYL ) 20 MG tablet Take 1 tablet by mouth twice daily 07/20/24   Beck, Taylor B, NP  ezetimibe  (ZETIA ) 10 MG tablet Take 1 tablet (10 mg total) by mouth daily. 08/13/24   Almarie Waddell NOVAK, NP  gabapentin  (NEURONTIN ) 100 MG capsule Take 1 capsule (100 mg total) by mouth 3 (three) times daily. Patient not taking: Reported on 09/21/2024 07/06/24   Almarie Waddell B, NP  lisinopril (ZESTRIL) 5 MG tablet Take  5 mg by mouth daily.    [provider]  metFORMIN  (GLUCOPHAGE -XR) 500 MG 24 hr tablet Take 2 tablets (1,000 mg total) by mouth daily with breakfast. 08/10/24   Almarie Waddell NOVAK, NP  metoprolol  succinate (TOPROL -XL) 25 MG 24 hr tablet Take 1 tablet (25 mg total) by mouth daily. Take with or immediately following a meal 07/24/24   Almarie Waddell NOVAK, NP  Multiple Vitamin (MULTIVITAMIN) tablet Take 1 tablet by mouth daily.    [provider]  ondansetron  (ZOFRAN -ODT) 4 MG disintegrating tablet Take 1 tablet (4 mg total) by mouth every 8 (eight) hours as needed for nausea or vomiting. 06/21/24   Dreama Longs, MD  pantoprazole  (PROTONIX ) 20 MG tablet Take 2 tablets (40 mg total) by mouth daily. 08/05/24   Almarie Waddell NOVAK, NP  polyethylene glycol (MIRALAX  / GLYCOLAX ) 17 g packet Take 17 g by mouth daily as needed for mild constipation or moderate constipation.    [provider]    Allergies: Latex, Codeine, Dextromethorphan, Diazepam, Doxycycline, Elemental sulfur, Hydromorphone , Moxifloxacin, Nitrofurantoin, Penicillins, Propoxyphene, Pseudoephedrine, Sulfa antibiotics, Sulfamethoxazole, Sulfur, Tetracycline hcl, Tramadol, and Morphine    Review of Systems See HPI  Updated Vital Signs BP (!) 132/91   Pulse 77   Temp 97.8 F (36.6 C) (Oral)   Resp 15   Ht 5' 3 (1.6 m)   Wt 70.3 kg   SpO2  99%   BMI 27.46 kg/m   Physical Exam Vitals and nursing note reviewed.  HENT:     Head: Normocephalic and atraumatic.     Mouth/Throat:     Mouth: Mucous membranes are moist.  Eyes:     General:        Right eye: No discharge.        Left eye: No discharge.     Conjunctiva/sclera: Conjunctivae normal.  Cardiovascular:     Rate and Rhythm: Normal rate and regular rhythm.     Pulses: Normal pulses.     Heart sounds: Normal heart sounds.  Pulmonary:     Effort: Pulmonary effort is normal.     Breath sounds: Normal breath sounds.  Abdominal:     General: Abdomen is flat.      Palpations: Abdomen is soft.  Skin:    General: Skin is warm and dry.  Neurological:     General: No focal deficit present.  Psychiatric:        Mood and Affect: Mood normal.     (all labs ordered are listed, but only abnormal results are displayed) Labs Reviewed  CBC - Abnormal; Notable for the following components:      Result Value   Platelets 404 (*)    All other components within normal limits  BASIC METABOLIC PANEL WITH GFR - Abnormal; Notable for the following components:   Sodium 127 (*)    Chloride 91 (*)    All other components within normal limits    EKG: None  Radiology: No results found.   Procedures   Medications Ordered in the ED - No data to display                                  Medical Decision Making Amount and/or Complexity of Data Reviewed Labs: ordered.   69 year old well-appearing female presenting for abnormal lab.  Was told that her sodium was 125.  Here it is 127.  Exam was unremarkable and she does not have any symptoms associated with hyponatremia at this time.  Likely effectively treating with p.o. electrolyte rehydration.  Also clinically does not appear to be volume overloaded.  Advised PCP follow-up for reevaluation and recheck of her labs.  Discussed return precautions.  Discharged good condition.     Final diagnoses:  Hyponatremia    ED Discharge Orders     None          Lang Norleen POUR, PA-C 10/09/24 1624    Neysa Caron PARAS, DO 10/09/24 2241  "

## 2024-10-11 ENCOUNTER — Other Ambulatory Visit: Payer: Self-pay | Admitting: Family Medicine

## 2024-10-11 DIAGNOSIS — F419 Anxiety disorder, unspecified: Secondary | ICD-10-CM

## 2024-10-12 ENCOUNTER — Other Ambulatory Visit

## 2024-10-12 ENCOUNTER — Ambulatory Visit: Payer: Self-pay | Admitting: Family Medicine

## 2024-10-12 ENCOUNTER — Ambulatory Visit: Admitting: Family Medicine

## 2024-10-12 DIAGNOSIS — E871 Hypo-osmolality and hyponatremia: Secondary | ICD-10-CM | POA: Diagnosis not present

## 2024-10-12 LAB — CORTISOL: Cortisol, Plasma: 16.3 ug/dL

## 2024-10-12 LAB — TSH: TSH: 5 u[IU]/mL (ref 0.35–5.50)

## 2024-10-12 LAB — BASIC METABOLIC PANEL WITH GFR
BUN: 9 mg/dL (ref 6–23)
CO2: 28 meq/L (ref 19–32)
Calcium: 9.6 mg/dL (ref 8.4–10.5)
Chloride: 90 meq/L — ABNORMAL LOW (ref 96–112)
Creatinine, Ser: 0.71 mg/dL (ref 0.40–1.20)
GFR: 85.92 mL/min
Glucose, Bld: 98 mg/dL (ref 70–99)
Potassium: 4.5 meq/L (ref 3.5–5.1)
Sodium: 130 meq/L — ABNORMAL LOW (ref 135–145)

## 2024-10-20 ENCOUNTER — Telehealth: Payer: Self-pay

## 2024-10-20 NOTE — Telephone Encounter (Signed)
 Pt called to cancel both appts. MP gave new dates for US  and post op visit with PA. However, pt is choosing not to r/s and doesn't want to seek care at this time. MP asked pt for documentation purposes why the pt doesn't want to keep post op. Pt wasn't exactly clear as to why, but states she doesn't have any drs appts and wants to keep it that way, and thus ended our conversation.

## 2024-10-27 ENCOUNTER — Ambulatory Visit (HOSPITAL_COMMUNITY)

## 2024-11-02 ENCOUNTER — Ambulatory Visit: Admitting: Family Medicine

## 2024-12-08 ENCOUNTER — Ambulatory Visit: Admitting: Cardiology
